# Patient Record
Sex: Female | Born: 1995 | Hispanic: Yes | Marital: Single | Smoking: Never smoker
Health system: Southern US, Community
[De-identification: ages and names within clinical notes are randomized; demographics above are authoritative.]

## PROBLEM LIST (undated history)

## (undated) DIAGNOSIS — N920 Excessive and frequent menstruation with regular cycle: Secondary | ICD-10-CM

## (undated) DIAGNOSIS — R29898 Other symptoms and signs involving the musculoskeletal system: Secondary | ICD-10-CM

## (undated) DIAGNOSIS — F419 Anxiety disorder, unspecified: Secondary | ICD-10-CM

## (undated) DIAGNOSIS — R102 Pelvic and perineal pain unspecified side: Secondary | ICD-10-CM

## (undated) DIAGNOSIS — Q766 Other congenital malformations of ribs: Secondary | ICD-10-CM

## (undated) HISTORY — DX: Other congenital malformations of ribs: Q76.6

## (undated) HISTORY — DX: Anxiety disorder, unspecified: F41.9

## (undated) HISTORY — DX: Other symptoms and signs involving the musculoskeletal system: R29.898

## (undated) HISTORY — DX: Pelvic and perineal pain: R10.2

## (undated) HISTORY — DX: Pelvic and perineal pain unspecified side: R10.20

## (undated) HISTORY — DX: Excessive and frequent menstruation with regular cycle: N92.0

---
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
## 2017-04-08 ENCOUNTER — Encounter: Payer: Self-pay | Admitting: Certified Nurse Midwife

## 2017-04-08 ENCOUNTER — Ambulatory Visit (INDEPENDENT_AMBULATORY_CARE_PROVIDER_SITE_OTHER): Payer: Medicaid Other | Admitting: Certified Nurse Midwife

## 2017-04-08 VITALS — BP 112/83 | HR 79 | Ht 61.0 in | Wt 135.6 lb

## 2017-04-08 DIAGNOSIS — Z0001 Encounter for general adult medical examination with abnormal findings: Secondary | ICD-10-CM

## 2017-04-08 DIAGNOSIS — R1030 Lower abdominal pain, unspecified: Secondary | ICD-10-CM | POA: Diagnosis not present

## 2017-04-08 DIAGNOSIS — R102 Pelvic and perineal pain: Secondary | ICD-10-CM

## 2017-04-08 DIAGNOSIS — N941 Unspecified dyspareunia: Secondary | ICD-10-CM | POA: Diagnosis not present

## 2017-04-08 DIAGNOSIS — Z01411 Encounter for gynecological examination (general) (routine) with abnormal findings: Secondary | ICD-10-CM

## 2017-04-08 MED ORDER — NORETHIN ACE-ETH ESTRAD-FE 1-20 MG-MCG PO TABS: 1.0000 | ORAL_TABLET | Freq: Every day | ORAL | 4 refills | Status: DC

## 2017-04-08 NOTE — Patient Instructions (Signed)
Preventive Care 18-39 Years, Female Preventive care refers to lifestyle choices and visits with your health care provider that can promote health and wellness. What does preventive care include?  A yearly physical exam. This is also called an annual well check.  Dental exams once or twice a year.  Routine eye exams. Ask your health care provider how often you should have your eyes checked.  Personal lifestyle choices, including: ? Daily care of your teeth and gums. ? Regular physical activity. ? Eating a healthy diet. ? Avoiding tobacco and drug use. ? Limiting alcohol use. ? Practicing safe sex. ? Taking vitamin and mineral supplements as recommended by your health care provider. What happens during an annual well check? The services and screenings done by your health care provider during your annual well check will depend on your age, overall health, lifestyle risk factors, and family history of disease. Counseling Your health care provider may ask you questions about your:  Alcohol use.  Tobacco use.  Drug use.  Emotional well-being.  Home and relationship well-being.  Sexual activity.  Eating habits.  Work and work Statistician.  Method of birth control.  Menstrual cycle.  Pregnancy history.  Screening You may have the following tests or measurements:  Height, weight, and BMI.  Diabetes screening. This is done by checking your blood sugar (glucose) after you have not eaten for a while (fasting).  Blood pressure.  Lipid and cholesterol levels. These may be checked every 5 years starting at age 66.  Skin check.  Hepatitis C blood test.  Hepatitis B blood test.  Sexually transmitted disease (STD) testing.  BRCA-related cancer screening. This may be done if you have a family history of breast, ovarian, tubal, or peritoneal cancers.  Pelvic exam and Pap test. This may be done every 3 years starting at age 40. Starting at age 59, this may be done every 5  years if you have a Pap test in combination with an HPV test.  Discuss your test results, treatment options, and if necessary, the need for more tests with your health care provider. Vaccines Your health care provider may recommend certain vaccines, such as:  Influenza vaccine. This is recommended every year.  Tetanus, diphtheria, and acellular pertussis (Tdap, Td) vaccine. You may need a Td booster every 10 years.  Varicella vaccine. You may need this if you have not been vaccinated.  HPV vaccine. If you are 69 or younger, you may need three doses over 6 months.  Measles, mumps, and rubella (MMR) vaccine. You may need at least one dose of MMR. You may also need a second dose.  Pneumococcal 13-valent conjugate (PCV13) vaccine. You may need this if you have certain conditions and were not previously vaccinated.  Pneumococcal polysaccharide (PPSV23) vaccine. You may need one or two doses if you smoke cigarettes or if you have certain conditions.  Meningococcal vaccine. One dose is recommended if you are age 27-21 years and a first-year college student living in a residence hall, or if you have one of several medical conditions. You may also need additional booster doses.  Hepatitis A vaccine. You may need this if you have certain conditions or if you travel or work in places where you may be exposed to hepatitis A.  Hepatitis B vaccine. You may need this if you have certain conditions or if you travel or work in places where you may be exposed to hepatitis B.  Haemophilus influenzae type b (Hib) vaccine. You may need this if  you have certain risk factors.  Talk to your health care provider about which screenings and vaccines you need and how often you need them. This information is not intended to replace advice given to you by your health care provider. Make sure you discuss any questions you have with your health care provider. Document Released: 10/30/2001 Document Revised: 05/23/2016  Document Reviewed: 07/05/2015 Elsevier Interactive Patient Education  2017 [employer].

## 2017-04-08 NOTE — Progress Notes (Signed)
ANNUAL PREVENTATIVE CARE GYN  ENCOUNTER NOTE  Subjective:       Suzanne Nixon is a 21 y.o. G0P0000 female here for a routine annual gynecologic exam.  Current complaints include: dysmenorrhea, dyspareunia, and pelvic pain for the last year. No relief with home measures.  Denies difficulty breathing or respiratory distress, chest pain, excessive vaginal bleeding, change in vaginal discharge, dysuria, and leg pain or swelling.    Gynecologic History  Patient's last menstrual period was 04/01/2017.  Contraception: OCP (estrogen/progesterone)  Last Pap: N/A.   Menstrual History  Period Cycle (Days): 30 Period Duration (Days): four (4) to seven (7) Period Pattern: (!) Irregular Menstrual Flow: Moderate, Heavy Menstrual Control: Tampon, Maxi pad Menstrual Control Change Freq (Hours): two (2) to three (3) Dysmenorrhea: (!) Moderate Dysmenorrhea Symptoms: Cramping, Other (Comment), Nausea, Headache (pelvic pain),  Obstetric History  OB History  Gravida Para Term Preterm AB Living  0 0 0 0 0 0  SAB TAB Ectopic Multiple Live Births  0 0 0 0 0        Past Medical History:  Diagnosis Date  . Heavy periods   . Pelvic pain     History reviewed. No pertinent surgical history.  No Known Allergies  Social History   Social History  . Marital status: Single    Spouse name: N/A  . Number of children: N/A  . Years of education: N/A   Occupational History  . Not on file.   Social History Main Topics  . Smoking status: Never Smoker  . Smokeless tobacco: Never Used  . Alcohol use No  . Drug use: No  . Sexual activity: Yes    Birth control/ protection: Pill   Other Topics Concern  . Not on file   Social History Narrative  . No narrative on file    Family History  Problem Relation Age of Onset  . HIV/AIDS Mother   . Cancer Maternal Uncle        lung    The following portions of the patient's history were reviewed and updated as appropriate: allergies, current  medications, past family history, past medical history, past social history, past surgical history and problem list.  Review of Systems  ROS negative except as noted above. Information obtained from patient.    Objective:   BP 112/83   Pulse 79   Ht 5\' 1"  (1.549 m)   Wt 135 lb 9.6 oz (61.5 kg)   LMP 04/01/2017   BMI 25.62 kg/m    CONSTITUTIONAL: Well-developed, well-nourished female in no acute distress.   PSYCHIATRIC: Normal mood and affect. Normal behavior. Normal judgment and thought content.  NEUROLGIC: Alert and oriented to person, place, and time. Normal muscle tone coordination. No cranial nerve  deficit noted.  HENT:  Normocephalic, atraumatic, External right and left ear normal. Oropharynx is clear and moist  EYES: Conjunctivae and EOM are normal. Pupils are equal, round, and reactive to light. No scleral icterus.   NECK: Normal range of motion, supple, no masses.  Normal thyroid.   SKIN: Skin is warm and dry. No rash noted. Not diaphoretic. No erythema. No pallor.  CARDIOVASCULAR: Normal heart rate noted, regular rhythm, no murmur.  RESPIRATORY: Clear to auscultation bilaterally. Effort and breath sounds normal, no problems with respiration  noted.  BREASTS: Symmetric in size. No masses, skin changes, nipple drainage, or lymphadenopathy.  ABDOMEN: Soft, normal bowel sounds, no distention noted.  No tenderness, rebound or guarding.   PELVIC:  External Genitalia: Normal  Vagina:  Normal  Cervix: Normal  Uterus: Normal  Adnexa: Normal   MUSCULOSKELETAL: Normal range of motion. No tenderness.  No cyanosis, clubbing, or edema.  2+ distal pulses.  LYMPHATIC: No Axillary, Supraclavicular, or Inguinal Adenopathy.  Assessment:   Annual gynecologic examination 21 y.o.   Contraception: OCP (estrogen/progesterone)   Overweight   Problem List Items Addressed This Visit    None    Visit Diagnoses    Pelvic pain    -  Primary   Relevant Orders   US  Transvaginal Non-OB   US Pelvis Complete   Dyspareunia in female       Relevant Orders   US Transvaginal Non-OB   US Pelvis Complete   Lower abdominal pain       Relevant Orders   US Transvaginal Non-OB   US Pelvis Complete   Encounter for gynecological examination with abnormal finding       Relevant Orders   CBC   Comprehensive metabolic panel      Plan:   Pap: Not needed  Labs: CBC, CMP, and NuSwab; see orders. Will contact pt via MyChart with results.  Routine preventative health maintenance measures emphasized: Exercise/Diet/Weight control, Tobacco Warnings, Alcohol/Substance use risks, Stress Management, Peer Pressure Issues and Safe Sex.   Rx: Junel Fe, see orders.   Reviewed red flag symptoms and when to call.   RTC x 1-2 weeks for US due to pelvic pain.   RTC x 1 year for annual exam or sooner if needed.      Gunnar BullaJenkins Michelle Levis Nazir, CNM

## 2017-04-09 LAB — COMPREHENSIVE METABOLIC PANEL
ALBUMIN: 4.3 g/dL (ref 3.5–5.5)
ALK PHOS: 57 IU/L (ref 39–117)
ALT: 15 IU/L (ref 0–32)
AST: 17 IU/L (ref 0–40)
Albumin/Globulin Ratio: 1.3 (ref 1.2–2.2)
BUN / CREAT RATIO: 12 (ref 9–23)
BUN: 9 mg/dL (ref 6–20)
Bilirubin Total: 0.3 mg/dL (ref 0.0–1.2)
CALCIUM: 9.4 mg/dL (ref 8.7–10.2)
CO2: 23 mmol/L (ref 20–29)
CREATININE: 0.73 mg/dL (ref 0.57–1.00)
Chloride: 103 mmol/L (ref 96–106)
GFR calc Af Amer: 138 mL/min/{1.73_m2} (ref >59)
GFR, EST NON AFRICAN AMERICAN: 120 mL/min/{1.73_m2} (ref >59)
GLOBULIN, TOTAL: 3.2 g/dL (ref 1.5–4.5)
GLUCOSE: 85 mg/dL (ref 65–99)
Potassium: 4 mmol/L (ref 3.5–5.2)
Sodium: 140 mmol/L (ref 134–144)
Total Protein: 7.5 g/dL (ref 6.0–8.5)

## 2017-04-09 LAB — CBC
HEMATOCRIT: 41.9 % (ref 34.0–46.6)
HEMOGLOBIN: 14.3 g/dL (ref 11.1–15.9)
MCH: 30.4 pg (ref 26.6–33.0)
MCHC: 34.1 g/dL (ref 31.5–35.7)
MCV: 89 fL (ref 79–97)
Platelets: 246 10*3/uL (ref 150–379)
RBC: 4.71 x10E6/uL (ref 3.77–5.28)
RDW: 13.3 % (ref 12.3–15.4)
WBC: 4.3 10*3/uL (ref 3.4–10.8)

## 2017-04-13 LAB — NUSWAB VAGINITIS PLUS (VG+)
CANDIDA ALBICANS, NAA: NEGATIVE
CANDIDA GLABRATA, NAA: NEGATIVE
CHLAMYDIA TRACHOMATIS, NAA: NEGATIVE
NEISSERIA GONORRHOEAE, NAA: NEGATIVE
TRICH VAG BY NAA: NEGATIVE

## 2017-04-15 NOTE — Progress Notes (Signed)
Please contact patient and advise NuSwab negative and labs normal. Also, encouraged to activate MyChart. Thanks, JML

## 2017-04-19 ENCOUNTER — Telehealth: Payer: Self-pay | Admitting: *Deleted

## 2017-04-19 NOTE — Telephone Encounter (Signed)
-----   Message from Gunnar BullaJenkins Michelle Lawhorn, CNM sent at 04/15/2017  2:00 PM EDT ----- Please contact patient and advise NuSwab negative and labs normal. Also, encouraged to activate MyChart. Thanks, JML

## 2017-04-19 NOTE — Telephone Encounter (Signed)
Mailed all info to pt 

## 2017-04-22 ENCOUNTER — Ambulatory Visit (INDEPENDENT_AMBULATORY_CARE_PROVIDER_SITE_OTHER): Payer: Medicaid Other

## 2017-04-22 DIAGNOSIS — R102 Pelvic and perineal pain: Secondary | ICD-10-CM | POA: Diagnosis not present

## 2017-04-22 DIAGNOSIS — N941 Unspecified dyspareunia: Secondary | ICD-10-CM | POA: Diagnosis not present

## 2017-04-22 DIAGNOSIS — R1030 Lower abdominal pain, unspecified: Secondary | ICD-10-CM | POA: Diagnosis not present

## 2017-04-24 ENCOUNTER — Encounter: Payer: Self-pay | Admitting: Certified Nurse Midwife

## 2017-10-11 ENCOUNTER — Encounter: Payer: Self-pay | Admitting: Certified Nurse Midwife

## 2017-10-15 ENCOUNTER — Ambulatory Visit (INDEPENDENT_AMBULATORY_CARE_PROVIDER_SITE_OTHER): Payer: Medicaid Other | Admitting: Certified Nurse Midwife

## 2017-10-15 VITALS — BP 117/65 | HR 79 | Ht 61.0 in | Wt 127.1 lb

## 2017-10-15 DIAGNOSIS — R222 Localized swelling, mass and lump, trunk: Secondary | ICD-10-CM | POA: Diagnosis not present

## 2017-10-15 DIAGNOSIS — N898 Other specified noninflammatory disorders of vagina: Secondary | ICD-10-CM

## 2017-10-15 NOTE — Progress Notes (Signed)
Pt has a "lump" right side towards her back. Denies soreness.

## 2017-10-17 ENCOUNTER — Encounter: Payer: Self-pay | Admitting: Certified Nurse Midwife

## 2017-10-18 ENCOUNTER — Encounter: Payer: Medicaid Other | Admitting: Certified Nurse Midwife

## 2017-10-19 NOTE — Progress Notes (Signed)
GYN ENCOUNTER NOTE  Subjective:       Suzanne Nixon is a 22 y.o. G0P0000 female here for evaluation of "pain mass/lump on her right side" and vaginal dryness since resuming OCPs.   First noticed intermittent sharp pain with movement at the end of December. Pain returned approximately a week and a half ago. Has not attempted any home treatment measures.   Denies difficulty breathing or respiratory distress, chest pain, excessive vaginal bleeding, dysuria, and leg pain or swelling.    Gynecologic History  Patient's last menstrual period was 09/23/2017 (approximate).   Contraception: OCP (estrogen/progesterone)  Last Pap: N/A.  Obstetric History  OB History  Gravida Para Term Preterm AB Living  0 0 0 0 0 0  SAB TAB Ectopic Multiple Live Births  0 0 0 0 0        Past Medical History:  Diagnosis Date  . Heavy periods   . Pelvic pain     No past surgical history on file.  Current Outpatient Medications on File Prior to Visit  Medication Sig Dispense Refill  . norethindrone-ethinyl estradiol (JUNEL FE 1/20) 1-20 MG-MCG tablet Take 1 tablet by mouth daily. 3 Package 4   No current facility-administered medications on file prior to visit.     No Known Allergies  Social History   Socioeconomic History  . Marital status: Single    Spouse name: Not on file  . Number of children: Not on file  . Years of education: Not on file  . Highest education level: Not on file  Social Needs  . Financial resource strain: Not on file  . Food insecurity - worry: Not on file  . Food insecurity - inability: Not on file  . Transportation needs - medical: Not on file  . Transportation needs - non-medical: Not on file  Occupational History  . Not on file  Tobacco Use  . Smoking status: Never Smoker  . Smokeless tobacco: Never Used  Substance and Sexual Activity  . Alcohol use: No  . Drug use: No  . Sexual activity: Yes    Birth control/protection: Pill  Other Topics Concern  .  Not on file  Social History Narrative  . Not on file    Family History  Problem Relation Age of Onset  . HIV/AIDS Mother   . Cancer Maternal Uncle        lung    The following portions of the patient's history were reviewed and updated as appropriate: allergies, current medications, past family history, past medical history, past social history, past surgical history and problem list.  Review of Systems:  Review of Systems - Negative except as noted above. History obtained from the patient.  Objective:   BP 117/65   Pulse 79   Ht 5\' 1"  (1.549 m)   Wt 127 lb 2 oz (57.7 kg)   LMP 09/23/2017 (Approximate)   BMI 24.02 kg/m    General: Alert and oriented x 4, no apparent distress.   Trunk: Two (2) cm, oblong, oval, firm, mobile, flat mass/lump present on right side; otherwise wnl.   Assessment:   1. Localized swelling, mass and lump, trunk  2. Vaginal dryness  Plan:   Discussed home treatment measures including use of Motrin and Tylenol.   Referral to General Surgery, see orders.   Samples of Uberlube given.  Reviewed red flag symptoms and when to call.   RTC as needed.    Gunnar BullaJenkins Michelle Jary Louvier, CNM [facility], [facility]

## 2017-10-28 ENCOUNTER — Encounter: Payer: Self-pay | Admitting: Certified Nurse Midwife

## 2017-10-28 ENCOUNTER — Ambulatory Visit (INDEPENDENT_AMBULATORY_CARE_PROVIDER_SITE_OTHER): Payer: Medicaid Other | Admitting: Certified Nurse Midwife

## 2017-10-28 VITALS — BP 108/62 | HR 71 | Ht 62.0 in | Wt 128.7 lb

## 2017-10-28 DIAGNOSIS — R102 Pelvic and perineal pain: Secondary | ICD-10-CM | POA: Diagnosis not present

## 2017-10-28 DIAGNOSIS — N898 Other specified noninflammatory disorders of vagina: Secondary | ICD-10-CM

## 2017-10-28 DIAGNOSIS — Z87440 Personal history of urinary (tract) infections: Secondary | ICD-10-CM

## 2017-10-28 LAB — POCT URINALYSIS DIPSTICK
Bilirubin, UA: NEGATIVE
Glucose, UA: NEGATIVE
KETONES UA: NEGATIVE
Leukocytes, UA: NEGATIVE
Nitrite, UA: NEGATIVE
PH UA: 6 (ref 5.0–8.0)
Protein, UA: NEGATIVE
RBC UA: NEGATIVE
SPEC GRAV UA: 1.015 (ref 1.010–1.025)
UROBILINOGEN UA: 0.2 U/dL

## 2017-10-28 NOTE — Patient Instructions (Addendum)
Vaginitis Vaginitis is an inflammation of the vagina. It can happen when the normal bacteria and yeast in the vagina grow too much. There are different types. Treatment will depend on the type you have. Follow these instructions at home:  Take all medicines as told by your doctor.  Keep your vagina area clean and dry. Avoid soap. Rinse the area with water.  Avoid washing and cleaning out the vagina (douching).  Do not use tampons or have sex (intercourse) until your treatment is done.  Wipe from front to back after going to the restroom.  Wear cotton underwear.  Avoid wearing underwear while you sleep until your vaginitis is gone.  Avoid tight pants. Avoid underwear or nylons without a cotton panel.  Take off wet clothing (such as a bathing suit) as soon as you can.  Use mild, unscented products. Avoid fabric softeners and scented: ? Feminine sprays. ? Laundry detergents. ? Tampons. ? Soaps or bubble baths.  Practice safe sex and use condoms. Get help right away if:  You have belly (abdominal) pain.  You have a fever or lasting symptoms for more than 2-3 days.  You have a fever and your symptoms suddenly get worse. This information is not intended to replace advice given to you by your health care provider. Make sure you discuss any questions you have with your health care provider. Document Released: 11/30/2008 Document Revised: 02/09/2016 Document Reviewed: 02/14/2012 Elsevier Interactive Patient Education  2017 Elsevier Inc. Pelvic Pain, Female Pelvic pain is pain in your lower belly (abdomen), below your belly button and between your hips. The pain may start suddenly (acute), keep coming back (recurring), or last a long time (chronic). Pelvic pain that lasts longer than six months is considered chronic. There are many causes of pelvic pain. Sometimes the cause of your pelvic pain is not known. Follow these instructions at home:  Take over-the-counter and prescription  medicines only as told by your doctor.  Rest as told by your doctor.  Do not have sex it if hurts.  Keep a journal of your pelvic pain. Write down: ? When the pain started. ? Where the pain is located. ? What seems to make the pain better or worse, such as food or your menstrual cycle. ? Any symptoms you have along with the pain.  Keep all follow-up visits as told by your doctor. This is important. Contact a doctor if:  Medicine does not help your pain.  Your pain comes back.  You have new symptoms.  You have unusual vaginal discharge or bleeding.  You have a fever or chills.  You are having a hard time pooping (constipation).  You have blood in your pee (urine) or poop (stool).  Your pee smells bad.  You feel weak or lightheaded. Get help right away if:  You have sudden pain that is very bad.  Your pain continues to get worse.  You have very bad pain and also have any of the following symptoms: ? A fever. ? Feeling stick to your stomach (nausea). ? Throwing up (vomiting). ? Being very sweaty.  You pass out (lose consciousness). This information is not intended to replace advice given to you by your health care provider. Make sure you discuss any questions you have with your health care provider. Document Released: 02/20/2008 Document Revised: 09/28/2015 Document Reviewed: 06/24/2015 Elsevier Interactive Patient Education  [employer]2018 Else[employer] Inc[employer]

## 2017-10-29 NOTE — Progress Notes (Signed)
CHIEF COMPLAINT/HPI:  22 y.o. female complains of clear, malodorous, thin and yellow vaginal discharge for the last week(s). History significant for pelvic pain.   Denies abnormal vaginal bleeding\ or fever. No UTI symptoms. Sexually active, does not use condoms, no change in partner.    Denies difficulty breathing or respiratory distress, abdominal pain, excessive vaginal bleeding, dysuria, and leg pain or swelling.   Review of Systems   Negative except as note above. Information obtained form patient.   Past Medical History:  Past Medical History:  Diagnosis Date  . Heavy periods   . Pelvic pain     Past Surgical History:  History reviewed. No pertinent surgical history.  Obstetrical History:  OB History    Gravida Para Term Preterm AB Living   0 0 0 0 0 0   SAB TAB Ectopic Multiple Live Births   0 0 0 0 0      Gynecological History:  Contraception: OCP (estrogen/progesterone)  Last pap:N/A.   Social History:  Social History   Socioeconomic History  . Marital status: In a relationship    Spouse name: None  . Number of children: None  . Years of education: None  . Highest education level: None  Social Needs  . Financial resource strain: None  . Food insecurity - worry: None  . Food insecurity - inability: None  . Transportation needs - medical: None  . Transportation needs - non-medical: None  Occupational History  . None  Tobacco Use  . Smoking status: Never Smoker  . Smokeless tobacco: Never Used  Substance and Sexual Activity  . Alcohol use: No  . Drug use: No  . Sexual activity: Yes    Birth control/protection: Pill  Other Topics Concern  . None  Social History Narrative  . None    Family History: Family History  Problem Relation Age of Onset  . HIV/AIDS Mother   . Cancer Maternal Uncle        lung    Allergies: No Known Allergies   PHYSICAL EXAM:  BP 108/62   Pulse 71   Ht 5\' 2"  (1.575 m)   Wt 128 lb 11.2 oz (58.4 kg)   LMP  10/23/2017   BMI 23.54 kg/m   Pelvic - VULVA: normal appearing vulva with no masses, tenderness or lesions, VAGINA: normal appearing vagina with normal color and discharge, no lesions, CERVIX: cervical motion tenderness absent, UTERUS: uterus is normal size, shape, consistency and nontender, ADNEXA: normal adnexa in size, nontender and no masses  Urinalysis    Component Value Date/Time   BILIRUBINUR neg 10/28/2017 1124   PROTEINUR neg 10/28/2017 1124   UROBILINOGEN 0.2 10/28/2017 1124   NITRITE neg 10/28/2017 1124   LEUKOCYTESUR Negative 10/28/2017 1124     Assessment:  Pelvic pain  Vaginal discharge  History of UTI  STI screening  Plan:  Labs: NuSwab and Urine culture, see orders. Will contact patient via MyChart with results.   Discussed home vaginal health techniques.   Reviewed red flag symptoms and when to call.    Gunnar BullaJenkins Michelle Juaquin Ludington, CNM [facility], [facility]

## 2017-10-30 LAB — URINE CULTURE: ORGANISM ID, BACTERIA: NO GROWTH

## 2017-10-31 ENCOUNTER — Ambulatory Visit (INDEPENDENT_AMBULATORY_CARE_PROVIDER_SITE_OTHER): Payer: Medicaid Other | Admitting: Surgery

## 2017-10-31 ENCOUNTER — Encounter: Payer: Self-pay | Admitting: Surgery

## 2017-10-31 VITALS — BP 122/71 | HR 73 | Temp 97.3°F | Ht 64.0 in | Wt 128.0 lb

## 2017-10-31 DIAGNOSIS — R222 Localized swelling, mass and lump, trunk: Secondary | ICD-10-CM

## 2017-10-31 DIAGNOSIS — R0789 Other chest pain: Secondary | ICD-10-CM | POA: Diagnosis not present

## 2017-10-31 LAB — NUSWAB VAGINITIS PLUS (VG+)
Atopobium vaginae: HIGH Score — AB
BVAB 2: HIGH {score} — AB
CANDIDA ALBICANS, NAA: POSITIVE — AB
CHLAMYDIA TRACHOMATIS, NAA: NEGATIVE
Candida glabrata, NAA: NEGATIVE
MEGASPHAERA 1: HIGH {score} — AB
Neisseria gonorrhoeae, NAA: NEGATIVE
TRICH VAG BY NAA: NEGATIVE

## 2017-10-31 NOTE — Progress Notes (Signed)
10/31/2017  Reason for Visit:  Right thorax mass  Referring Provider:   Holly Bodily, CNM  History of Present Illness: Suzanne Nixon is a 22 y.o. female who presents as a referral for evaluation of a right thoracic mass.  The patient reports that she does self breast exams and a couple of months ago noted that she had a swelling on the right back of the thorax.  She does not feel that it's grown in size but did have an episode of some tenderness around it.  Denies any trauma or other injury.  Denies any fevers, chills.  Denies any redness of the skin or drainage from that area.  Her PCP referred her for further evaluation.  Past Medical History: Past Medical History:  Diagnosis Date  . Heavy periods   . Pelvic pain      Past Surgical History: None  Home Medications: Prior to Admission medications   Medication Sig Start Date End Date Taking? Authorizing Provider  norethindrone-ethinyl estradiol (JUNEL FE 1/20) 1-20 MG-MCG tablet Take 1 tablet by mouth daily. 04/08/17  Yes Lawhorn, Vanessa Sidney, CNM    Allergies: No Known Allergies  Social History:  reports that  has never smoked. she has never used smokeless tobacco. She reports that she does not drink alcohol or use drugs.   Family History: Family History  Problem Relation Age of Onset  . HIV/AIDS Mother   . Cancer Maternal Uncle        lung    Review of Systems: Review of Systems  Constitutional: Negative for chills and fever.  Respiratory: Negative for shortness of breath.   Cardiovascular: Negative for chest pain.  Gastrointestinal: Negative for abdominal pain, nausea and vomiting.  Genitourinary: Negative for dysuria.  Musculoskeletal: Positive for back pain.  Skin: Negative for rash.  Neurological: Negative for dizziness.  Psychiatric/Behavioral: Negative for depression.  All other systems reviewed and are negative.   Physical Exam BP 122/71   Pulse 73   Temp (!) 97.3 F (36.3 C) (Oral)   Ht 5'  4" (1.626 m)   Wt 58.1 kg (128 lb)   LMP 10/23/2017   BMI 21.97 kg/m  CONSTITUTIONAL: No acute distress HEENT:  Normocephalic, atraumatic, extraocular motion intact. NECK: Trachea is midline, and there is no jugular venous distension.  RESPIRATORY:  Lungs are clear, and breath sounds are equal bilaterally. Normal respiratory effort without pathologic use of accessory muscles. CARDIOVASCULAR: Heart is regular without murmurs, gallops, or rubs. GI: The abdomen is soft, nondistended, nontender.  MUSCULOSKELETAL:  Patient has on the right posterior back a area of firmness and swelling, that is more noticeable when she turns her torso towards the left, which makes the mass bulge out more.  On palpation, it is firm and measure about 3 cm is size, and is not tender.  It is non-mobile.  On tracking with her ribs, this may be the 12th rib on the right side.  On the left side, a similar area is seen though with much less bulging when she turns to the right. SKIN: Skin turgor is normal. There are no pathologic skin lesions.  NEUROLOGIC:  Motor and sensation is grossly normal.  Cranial nerves are grossly intact. PSYCH:  Alert and oriented to person, place and time. Affect is normal.  Laboratory Analysis: No results found for this or any previous visit (from the past 24 hour(s)).  Imaging: No results found.  Assessment and Plan: This is a 22 y.o. female who presents with a palpable right  posterior thorax mass.  Discussed with the patient that this feels hard, and lipoma would be lower in the differential.  I believe this could be part of the 12th rib that is bulging when she turns.  I am less concerned about a potential mass or tumor, but given the uncertainty, will order a CT scan of the chest to evaluate her ribs and for any potential masses.  Depending on the results, she could potentially need referral to Thoracic Surgery with Dr. Thelma Bargeaks.  Patient understands this plan and all of her questions have  been answered.  Face-to-face time spent with the patient and care providers was 60 minutes, with more than 50% of the time spent counseling, educating, and coordinating care of the patient.     Howie IllJose Luis Kamisha Ell, MD [facility]

## 2017-10-31 NOTE — Patient Instructions (Signed)
We will give you a call you once we have the results. We will also let you know if your insurance does not want to cover for the CT Scan.   We will call you with results.

## 2017-11-01 NOTE — Progress Notes (Signed)
Patient positive for yeast and Bv. Please contact to see which route of treatment she desires for each. Thanks, JML

## 2017-11-06 ENCOUNTER — Telehealth: Payer: Self-pay

## 2017-11-06 MED ORDER — METRONIDAZOLE 500 MG PO TABS: 500.0000 mg | ORAL_TABLET | Freq: Two times a day (BID) | ORAL | 0 refills | Status: DC

## 2017-11-06 NOTE — Telephone Encounter (Signed)
Pt aware of nuswab results. She prefers to get monistat otc for y/i. Will give flagyl for BV.

## 2017-11-06 NOTE — Telephone Encounter (Signed)
-----   Message from Gunnar BullaJenkins Michelle Lawhorn, CNM sent at 11/01/2017  1:54 PM EST ----- Patient positive for yeast and Bv. Please contact to see which route of treatment she desires for each. Thanks, JML

## 2017-11-11 ENCOUNTER — Other Ambulatory Visit: Payer: Self-pay

## 2017-11-11 DIAGNOSIS — R222 Localized swelling, mass and lump, trunk: Secondary | ICD-10-CM

## 2017-11-12 ENCOUNTER — Ambulatory Visit
Admission: RE | Admit: 2017-11-12 | Discharge: 2017-11-12 | Disposition: A | Discharge status: Home / Self Care | Payer: Medicaid Other | Source: Ambulatory Visit | Attending: Surgery | Admitting: Surgery

## 2017-11-12 ENCOUNTER — Ambulatory Visit: Admission: RE | Admit: 2017-11-12 | Payer: Medicaid Other | Source: Ambulatory Visit

## 2017-11-12 DIAGNOSIS — R222 Localized swelling, mass and lump, trunk: Secondary | ICD-10-CM | POA: Insufficient documentation

## 2017-11-13 ENCOUNTER — Telehealth: Payer: Self-pay | Admitting: Surgery

## 2017-11-13 NOTE — Telephone Encounter (Signed)
Patient would like to have CT of chest with contrast done at [facility]. Reason: patient lives in [place] and prefers this location due to distance from home.   [facility]. FAX: [phone] Phone: [phone].  I have informed patient that the orders have been faxed and she should be receiving a phone call from [facility] to [facility]. I will follow up to make sure an appointment is made. When imaging has been completed I will ask for [facility].

## 2017-11-14 NOTE — Telephone Encounter (Signed)
Patient has called and the CT scan has been scheduled with [facility]--11/19/17 @ 3:40pm. I have told the patient that we will request to have the images pushed into [facility] so that Dr Aleen CampiPiscoya can review the images and will call to either discuss the results or have the patient come in for an office visit.   Authorization has been obtained from [employer] for CPT: 71260-CT of chest with contrast to be done at [facility]-NPI:[id number].  Valid from 11/13/17-12/13/17-Ordered by Dr Aleen CampiPiscoya.  Authorization # [id number]

## 2017-11-20 ENCOUNTER — Telehealth: Payer: Self-pay

## 2017-11-20 NOTE — Telephone Encounter (Signed)
I called patient to let her know that her CT Scan was normal. However, I told patient that I will send Dr. Aleen CampiPiscoya a message to let him know to review her CT and then I call her with his recommendations. Patient understood.

## 2017-11-22 NOTE — Telephone Encounter (Signed)
-----   Message from Jose Piscoya, MD sent at 11/22/2017  2:18 PM EST ----- Regarding: RE: CT Scan results Hi Laqueta Bonaventura,  Finally was able to see the CT scan for this patient.  I agree with the read that there is nothing abnormal.  I do not see any masses particularly in the area that she's concerned with.  One concern is that still this could be a rib that moves when she twists her torso, and unfortunately, the CT scan is done with her flat and straight.    If she wants, we can send a referral for Dr. Oaks so he can evaluate her and see if it's truly a rib issue, he can resect it.  Thanks!  Jose   ----- Message ----- From: Breniya Goertzen, CMA Sent: 11/20/2017   2:10 PM To: Jose Piscoya, MD Subject: CT Scan results                                Patiet's CT Scan was done yesterday at [facility] and it did not show anything. What do you want me to do with the patient. Please advise. Thank you!  

## 2017-11-22 NOTE — Telephone Encounter (Signed)
Called patient and was not able to leave her a voicemail. I will try to call her back on Monday.

## 2017-11-26 ENCOUNTER — Encounter: Payer: Medicaid Other | Admitting: Certified Nurse Midwife

## 2017-11-26 NOTE — Telephone Encounter (Signed)
CT was done and patient knows about her results and Dr. Adelene IdlerPiscoya's recommendations. Please see other note.

## 2017-11-26 NOTE — Telephone Encounter (Signed)
-----   Message from Henrene DodgeJose Piscoya, MD sent at 11/22/2017  2:18 PM EST ----- Regarding: RE: CT Scan results Hi Greycen Felter,  Finally was able to see the CT scan for this patient.  I agree with the read that there is nothing abnormal.  I do not see any masses particularly in the area that she's concerned with.  One concern is that still this could be a rib that moves when she twists her torso, and unfortunately, the CT scan is done with her flat and straight.    If she wants, we can send a referral for Dr. Thelma Bargeaks so he can evaluate her and see if it's truly a rib issue, he can resect it.  Thanks!  Jose   ----- Message ----- From: Adela PortsBoniche, Mael Delap, CMA Sent: 11/20/2017   2:10 PM To: Henrene DodgeJose Piscoya, MD Subject: CT Scan results                                Patiet[facility] CT [facility] and it did not show anything. What do you want me to do with the patient. Please advise. Thank you!

## 2017-11-26 NOTE — Telephone Encounter (Signed)
Called patient and was able to finally speak with her. I told her what her CT Scan results and also gave her the option to see Dr. Thelma Bargeaks if it was truly bothering her as per Dr. Adelene IdlerPiscoya's recommendations. However, patient stated that she was fine but if she felt that it was getting worse, she would call us to schedule a consult with Dr. Thelma Bargeaks. Patient had no questions for me.

## 2018-04-09 ENCOUNTER — Telehealth: Payer: Self-pay | Admitting: Certified Nurse Midwife

## 2018-04-09 NOTE — Telephone Encounter (Signed)
error 

## 2018-04-10 ENCOUNTER — Ambulatory Visit (INDEPENDENT_AMBULATORY_CARE_PROVIDER_SITE_OTHER): Payer: Medicaid Other | Admitting: Certified Nurse Midwife

## 2018-04-10 VITALS — BP 97/58 | HR 66 | Ht 62.0 in | Wt 134.4 lb

## 2018-04-10 DIAGNOSIS — Z01419 Encounter for gynecological examination (general) (routine) without abnormal findings: Secondary | ICD-10-CM

## 2018-04-10 DIAGNOSIS — Z3041 Encounter for surveillance of contraceptive pills: Secondary | ICD-10-CM

## 2018-04-10 DIAGNOSIS — Z309 Encounter for contraceptive management, unspecified: Secondary | ICD-10-CM | POA: Diagnosis not present

## 2018-04-10 MED ORDER — NORETHIN ACE-ETH ESTRAD-FE 1-20 MG-MCG PO TABS: 1.0000 | ORAL_TABLET | Freq: Every day | ORAL | 4 refills | Status: DC

## 2018-04-10 NOTE — Patient Instructions (Addendum)
Preventive Care 18-39 Years, Female Preventive care refers to lifestyle choices and visits with your health care provider that can promote health and wellness. What does preventive care include?  A yearly physical exam. This is also called an annual well check.  Dental exams once or twice a year.  Routine eye exams. Ask your health care provider how often you should have your eyes checked.  Personal lifestyle choices, including: ? Daily care of your teeth and gums. ? Regular physical activity. ? Eating a healthy diet. ? Avoiding tobacco and drug use. ? Limiting alcohol use. ? Practicing safe sex. ? Taking vitamin and mineral supplements as recommended by your health care provider. What happens during an annual well check? The services and screenings done by your health care provider during your annual well check will depend on your age, overall health, lifestyle risk factors, and family history of disease. Counseling Your health care provider may ask you questions about your:  Alcohol use.  Tobacco use.  Drug use.  Emotional well-being.  Home and relationship well-being.  Sexual activity.  Eating habits.  Work and work Statistician.  Method of birth control.  Menstrual cycle.  Pregnancy history.  Screening You may have the following tests or measurements:  Height, weight, and BMI.  Diabetes screening. This is done by checking your blood sugar (glucose) after you have not eaten for a while (fasting).  Blood pressure.  Lipid and cholesterol levels. These may be checked every 5 years starting at age 66.  Skin check.  Hepatitis C blood test.  Hepatitis B blood test.  Sexually transmitted disease (STD) testing.  BRCA-related cancer screening. This may be done if you have a family history of breast, ovarian, tubal, or peritoneal cancers.  Pelvic exam and Pap test. This may be done every 3 years starting at age 40. Starting at age 59, this may be done every 5  years if you have a Pap test in combination with an HPV test.  Discuss your test results, treatment options, and if necessary, the need for more tests with your health care provider. Vaccines Your health care provider may recommend certain vaccines, such as:  Influenza vaccine. This is recommended every year.  Tetanus, diphtheria, and acellular pertussis (Tdap, Td) vaccine. You may need a Td booster every 10 years.  Varicella vaccine. You may need this if you have not been vaccinated.  HPV vaccine. If you are 69 or younger, you may need three doses over 6 months.  Measles, mumps, and rubella (MMR) vaccine. You may need at least one dose of MMR. You may also need a second dose.  Pneumococcal 13-valent conjugate (PCV13) vaccine. You may need this if you have certain conditions and were not previously vaccinated.  Pneumococcal polysaccharide (PPSV23) vaccine. You may need one or two doses if you smoke cigarettes or if you have certain conditions.  Meningococcal vaccine. One dose is recommended if you are age 27-21 years and a first-year college student living in a residence hall, or if you have one of several medical conditions. You may also need additional booster doses.  Hepatitis A vaccine. You may need this if you have certain conditions or if you travel or work in places where you may be exposed to hepatitis A.  Hepatitis B vaccine. You may need this if you have certain conditions or if you travel or work in places where you may be exposed to hepatitis B.  Haemophilus influenzae type b (Hib) vaccine. You may need this if  you have certain risk factors.  Talk to your health care provider about which screenings and vaccines you need and how often you need them. This information is not intended to replace advice given to you by your health care provider. Make sure you discuss any questions you have with your health care provider. Document Released: 10/30/2001 Document Revised: 05/23/2016  Document Reviewed: 07/05/2015 Elsevier Interactive Patient Education  2018 Elsevier Inc. Ethinyl Estradiol; Norethindrone Acetate; Ferrous fumarate tablets or capsules What is this medicine? ETHINYL ESTRADIOL; NORETHINDRONE ACETATE; FERROUS FUMARATE (ETH in il es tra DYE ole; nor eth IN drone AS e tate; FER us FUE ma rate) is an oral contraceptive. The products combine two types of female hormones, an estrogen and a progestin. They are used to prevent ovulation and pregnancy. Some products are also used to treat acne in females. This medicine may be used for other purposes; ask your health care provider or pharmacist if you have questions. COMMON BRAND NAME(S): Blisovi 24 Fe, Blisovi Fe, Estrostep Fe, Gildess 24 Fe, Gildess Fe 1.5/30, Gildess Fe 1/20, Junel Fe 1.5/30, Junel Fe 1/20, Junel Fe 24, Larin Fe, Lo Loestrin Fe, Loestrin 24 Fe, Loestrin FE 1.5/30, Loestrin FE 1/20, Lomedia 24 Fe, Microgestin 24 Fe, Microgestin Fe 1.5/30, Microgestin Fe 1/20, Tarina Fe 1/20, Taytulla, Tilia Fe, Tri-Legest Fe What should I tell my health care provider before I take this medicine? They need to know if you have any of these conditions: -abnormal vaginal bleeding -blood vessel disease -breast, cervical, endometrial, ovarian, liver, or uterine cancer -diabetes -gallbladder disease -heart disease or recent heart attack -high blood pressure -high cholesterol -history of blood clots -kidney disease -liver disease -migraine headaches -smoke tobacco -stroke -systemic lupus erythematosus (SLE) -an unusual or allergic reaction to estrogens, progestins, other medicines, foods, dyes, or preservatives -pregnant or trying to get pregnant -breast-feeding How should I use this medicine? Take this medicine by mouth. To reduce nausea, this medicine may be taken with food. Follow the directions on the prescription label. Take this medicine at the same time each day and in the order directed on the package. Do not  take your medicine more often than directed. A patient package insert for the product will be given with each prescription and refill. Read this sheet carefully each time. The sheet may change frequently. Contact your pediatrician regarding the use of this medicine in children. Special care may be needed. This medicine has been used in female children who have started having menstrual periods. Overdosage: If you think you have taken too much of this medicine contact a poison control center or emergency room at once. NOTE: This medicine is only for you. Do not share this medicine with others. What if I miss a dose? If you miss a dose, refer to the patient information sheet you received with your medicine for direction. If you miss more than one pill, this medicine may not be as effective and you may need to use another form of birth control. What may interact with this medicine? Do not take this medicine with the following medication: -dasabuvir; ombitasvir; paritaprevir; ritonavir -ombitasvir; paritaprevir; ritonavir This medicine may also interact with the following medications: -acetaminophen -antibiotics or medicines for infections, especially rifampin, rifabutin, rifapentine, and griseofulvin, and possibly penicillins or tetracyclines -aprepitant -ascorbic acid (vitamin C) -atorvastatin -barbiturate medicines, such as phenobarbital -bosentan -carbamazepine -caffeine -clofibrate -cyclosporine -dantrolene -doxercalciferol -felbamate -grapefruit juice -hydrocortisone -medicines for anxiety or sleeping problems, such as diazepam or temazepam -medicines for diabetes, including pioglitazone -mineral oil -  oil -modafinil -mycophenolate -nefazodone -oxcarbazepine -phenytoin -prednisolone -ritonavir or other medicines for HIV infection or AIDS -rosuvastatin -selegiline -soy isoflavones supplements -St. John's wort -tamoxifen or raloxifene -theophylline -thyroid  hormones -topiramate -warfarin This list may not describe all possible interactions. Give your health care provider a list of all the medicines, herbs, non-prescription drugs, or dietary supplements you use. Also tell them if you smoke, drink alcohol, or use illegal drugs. Some items may interact with your medicine. What should I watch for while using this medicine? Visit your doctor or health care professional for regular checks on your progress. You will need a regular breast and pelvic exam and Pap smear while on this medicine. Use an additional method of contraception during the first cycle that you take these tablets. If you have any reason to think you are pregnant, stop taking this medicine right away and contact your doctor or health care professional. If you are taking this medicine for hormone related problems, it may take several cycles of use to see improvement in your condition. Smoking increases the risk of getting a blood clot or having a stroke while you are taking birth control pills, especially if you are more than 21 years old. You are strongly advised not to smoke. This medicine can make your body retain fluid, making your fingers, hands, or ankles swell. Your blood pressure can go up. Contact your doctor or health care professional if you feel you are retaining fluid. This medicine can make you more sensitive to the sun. Keep out of the sun. If you cannot avoid being in the sun, wear protective clothing and use sunscreen. Do not use sun lamps or tanning beds/booths. If you wear contact lenses and notice visual changes, or if the lenses begin to feel uncomfortable, consult your eye care specialist. In some women, tenderness, swelling, or minor bleeding of the gums may occur. Notify your dentist if this happens. Brushing and flossing your teeth regularly may help limit this. See your dentist regularly and inform your dentist of the medicines you are taking. If you are going to have  elective surgery, you may need to stop taking this medicine before the surgery. Consult your health care professional for advice. This medicine does not protect you against HIV infection (AIDS) or any other sexually transmitted diseases. What side effects may I notice from receiving this medicine? Side effects that you should report to your doctor or health care professional as soon as possible: -allergic reactions like skin rash, itching or hives, swelling of the face, lips, or tongue -breast tissue changes or discharge -changes in vaginal bleeding during your period or between your periods -changes in vision -chest pain -confusion -coughing up blood -dizziness -feeling faint or lightheaded -headaches or migraines -leg, arm or groin pain -loss of balance or coordination -severe or sudden headaches -stomach pain (severe) -sudden shortness of breath -sudden numbness or weakness of the face, arm or leg -symptoms of vaginal infection like itching, irritation or unusual discharge -tenderness in the upper abdomen -trouble speaking or understanding -vomiting -yellowing of the eyes or skin Side effects that usually do not require medical attention (report to your doctor or health care professional if they continue or are bothersome): -breakthrough bleeding and spotting that continues beyond the 3 initial cycles of pills -breast tenderness -mood changes, anxiety, depression, frustration, anger, or emotional outbursts -increased sensitivity to sun or ultraviolet light -nausea -skin rash, acne, or brown spots on the skin -weight gain (slight) This list may not describe all   possible side effects. Call your doctor for medical advice about side effects. You may report side effects to FDA at 1-800-FDA-1088. Where should I keep my medicine? Keep out of the reach of children. Store at room temperature between 15 and 30 degrees C (59 and 86 degrees F). Throw away any unused medicine after the  expiration date. NOTE: This sheet is a summary. It may not cover all possible information. If you have questions about this medicine, talk to your doctor, pharmacist, or health care provider.  2018 Elsevier/Gold Standard (2016-05-14 08:04:41)

## 2018-04-10 NOTE — Progress Notes (Signed)
Pt is here for an annual exam.

## 2018-04-14 DIAGNOSIS — Z3041 Encounter for surveillance of contraceptive pills: Secondary | ICD-10-CM | POA: Insufficient documentation

## 2018-04-14 NOTE — Progress Notes (Signed)
ANNUAL PREVENTATIVE CARE GYN  ENCOUNTER NOTE  Subjective:       Suzanne Nixon is a 22 y.o. G0P0000 female here for a routine annual gynecologic exam.  Current complaints: 1. Needs refill of OCP  Doing well overall. [facility] [employer].   Denies difficulty breathing or respiratory distress, chest pain, abdominal pain, excessive vaginal bleeding, dysuria, and leg pain or swelling.    Gynecologic History  Patient's last menstrual period was 03/16/2018 (approximate).  Contraception: OCP (estrogen/progesterone)  Last Pap: N/A.   Obstetric History OB History  Gravida Para Term Preterm AB Living  0 0 0 0 0 0  SAB TAB Ectopic Multiple Live Births  0 0 0 0 0    Past Medical History:  Diagnosis Date  . Heavy periods   . Pelvic pain    No Known Allergies  Social History   Socioeconomic History  . Marital status: Single    Spouse name: Not on file  . Number of children: Not on file  . Years of education: Not on file  . Highest education level: Not on file  Occupational History  . Not on file  Social Needs  . Financial resource strain: Not on file  . Food insecurity:    Worry: Not on file    Inability: Not on file  . Transportation needs:    Medical: Not on file    Non-medical: Not on file  Tobacco Use  . Smoking status: Never Smoker  . Smokeless tobacco: Never Used  Substance and Sexual Activity  . Alcohol use: No  . Drug use: No  . Sexual activity: Yes    Birth control/protection: Pill  Lifestyle  . Physical activity:    Days per week: Not on file    Minutes per session: Not on file  . Stress: Not on file  Relationships  . Social connections:    Talks on phone: Not on file    Gets together: Not on file    Attends religious service: Not on file    Active member of club or organization: Not on file    Attends meetings of clubs or organizations: Not on file    Relationship status: Not on file  . Intimate partner violence:    Fear of  current or ex partner: Not on file    Emotionally abused: Not on file    Physically abused: Not on file    Forced sexual activity: Not on file  Other Topics Concern  . Not on file  Social History Narrative  . Not on file    Family History  Problem Relation Age of Onset  . HIV/AIDS Mother   . Cancer Maternal Uncle        lung    The following portions of the patient's history were reviewed and updated as appropriate: allergies, current medications, past family history, past medical history, past social history, past surgical history and problem list.  Review of Systems  ROS negative except as noted above. Information obtained from patient.   Objective:   BP (!) 97/58   Pulse 66   Ht 5\' 2"  (1.575 m)   Wt 134 lb 7 oz (61 kg)   LMP 03/16/2018 (Approximate)   BMI 24.59 kg/m    CONSTITUTIONAL: Well-developed, well-nourished female in no acute distress.   PSYCHIATRIC: Normal mood and affect. Normal behavior. Normal judgment and thought content.  NEUROLGIC: Alert and oriented to person, place, and time. Normal muscle tone coordination. No cranial nerve deficit noted.  HENT:  Normocephalic, atraumatic, External right and left ear normal.   EYES: Conjunctivae and EOM are normal. Pupils are equal and round.   NECK: Normal range of motion, supple, no masses.  Normal thyroid.   SKIN: Skin is warm and dry. No rash noted. Not diaphoretic. No erythema. No pallor.  CARDIOVASCULAR: Normal heart rate noted, regular rhythm, no murmur.  RESPIRATORY: Clear to auscultation bilaterally. Effort and breath sounds normal, no problems with respiration noted.  BREASTS: Symmetric in size. No masses, skin changes, nipple drainage, or lymphadenopathy.  ABDOMEN: Soft, normal bowel sounds, no distention noted.  No tenderness, rebound or guarding.   PELVIC:  External Genitalia: Normal  Vagina: Normal  Cervix: Normal  Uterus: Normal  Adnexa: Normal   MUSCULOSKELETAL: Normal range of motion.  No tenderness.  No cyanosis, clubbing, or edema.  2+ distal pulses.  LYMPHATIC: No Axillary, Supraclavicular, or Inguinal Adenopathy.  Assessment:   Annual gynecologic examination 22 y.o.   Contraception: OCP (estrogen/progesterone)   Normal BMI   Problem List Items Addressed This Visit    None    Visit Diagnoses    Well woman exam    -  Primary   Uses oral contraception          Plan:   Pap: Not needed  Labs: Declined  Routine preventative health maintenance measures emphasized: Exercise/Diet/Weight control, Tobacco Warnings, Alcohol/Substance use risks, Stress Management, Peer Pressure Issues and Safe Sex; see AVS  Rx: Junel, see orders  Reviewed red flag symptoms and when to call  RTC x 1 year for Annual Exam or sooner if needed   Gunnar Bulla, CNM [facility]

## 2018-09-05 ENCOUNTER — Encounter: Payer: Self-pay | Admitting: Certified Nurse Midwife

## 2019-01-06 ENCOUNTER — Encounter: Payer: Self-pay | Admitting: Certified Nurse Midwife

## 2019-01-09 ENCOUNTER — Other Ambulatory Visit: Payer: Self-pay

## 2019-01-09 ENCOUNTER — Ambulatory Visit (INDEPENDENT_AMBULATORY_CARE_PROVIDER_SITE_OTHER): Payer: Medicaid Other | Admitting: Certified Nurse Midwife

## 2019-01-09 ENCOUNTER — Encounter: Payer: Self-pay | Admitting: Certified Nurse Midwife

## 2019-01-09 VITALS — BP 118/67 | HR 77 | Ht 62.0 in | Wt 136.1 lb

## 2019-01-09 DIAGNOSIS — N912 Amenorrhea, unspecified: Secondary | ICD-10-CM

## 2019-01-09 DIAGNOSIS — Z3201 Encounter for pregnancy test, result positive: Secondary | ICD-10-CM

## 2019-01-09 LAB — POCT URINE PREGNANCY: Preg Test, Ur: POSITIVE — AB

## 2019-01-09 NOTE — Progress Notes (Signed)
GYN ENCOUNTER NOTE  Subjective:       Suzanne Nixon is a 23 y.o. G0P0000 female is here for confirmation of pregnancy.   Reports two (2) positive home pregnancy test. Endorses nausea with a single episode of vomiting, breast tenderness, and intermittent abdominal cramping.   Denies difficulty breathing or respiratory distress, chest pain, vaginal bleeding, dysuria, and leg pain or swelling.   Pregnancy was unplanned yet not prevented. Not taking prenatal vitamins at this time. Uncertain regarding parenting, adopting, or terminating pregnancy.    Gynecologic History  Patient's last menstrual period was 11/25/2018 (approximate).  Contraception: none  Last Pap: due.   Obstetric History  OB History  Gravida Para Term Preterm AB Living  0 0 0 0 0 0  SAB TAB Ectopic Multiple Live Births  0 0 0 0 0    Past Medical History:  Diagnosis Date  . Anxiety   . Finding of floating rib   . Heavy periods   . Pelvic pain    No Known Allergies  Social History   Socioeconomic History  . Marital status: Single    Spouse name: Not on file  . Number of children: Not on file  . Years of education: Not on file  . Highest education level: Not on file  Occupational History  . Not on file  Social Needs  . Financial resource strain: Not on file  . Food insecurity:    Worry: Not on file    Inability: Not on file  . Transportation needs:    Medical: Not on file    Non-medical: Not on file  Tobacco Use  . Smoking status: Never Smoker  . Smokeless tobacco: Never Used  Substance and Sexual Activity  . Alcohol use: No  . Drug use: No  . Sexual activity: Yes    Birth control/protection: Pill  Lifestyle  . Physical activity:    Days per week: Not on file    Minutes per session: Not on file  . Stress: Not on file  Relationships  . Social connections:    Talks on phone: Not on file    Gets together: Not on file    Attends religious service: Not on file    Active member of club or  organization: Not on file    Attends meetings of clubs or organizations: Not on file    Relationship status: Not on file  . Intimate partner violence:    Fear of current or ex partner: Not on file    Emotionally abused: Not on file    Physically abused: Not on file    Forced sexual activity: Not on file  Other Topics Concern  . Not on file  Social History Narrative  . Not on file    Family History  Problem Relation Age of Onset  . HIV/AIDS Mother   . Cancer Maternal Uncle        lung  . Diabetes Maternal Grandmother     The following portions of the patient's history were reviewed and updated as appropriate: allergies, current medications, past family history, past medical history, past social history, past surgical history and problem list.  Review of Systems  ROS negative except as noted above. Information obtained from patient.   Objective:   BP 118/67   Pulse 77   Ht 5\' 2"  (1.575 m)   Wt 136 lb 1 oz (61.7 kg)   LMP 11/25/2018 (Approximate)   BMI 24.89 kg/m    CONSTITUTIONAL: Well-developed, well-nourished female  in no acute distress.   Recent Results (from the past 2160 hour(s))  POCT urine pregnancy     Status: Abnormal   Collection Time: 01/09/19  2:26 PM  Result Value Ref Range   Preg Test, Ur Positive (A) Negative    Assessment:   1. Amenorrhea - POCT urine pregnancy   Plan:   First trimester education and A Women's Choice handout provided, see AVS.   Prenatal samples given.   Reviewed red flag symptoms and when to call.   May return for prenatal care if desired, see disposition.    Gunnar Bulla, CNM [facility], [facility] 01/09/19 3:05 PM

## 2019-01-09 NOTE — Patient Instructions (Signed)
Vaginal Bleeding During Pregnancy, First Trimester  A small amount of bleeding (spotting) from the vagina is common during early pregnancy. Sometimes the bleeding is normal and does not cause problems. At other times, though, bleeding may be a sign of something serious. Tell your doctor about any bleeding from your vagina right away. Follow these instructions at home: Activity  Follow your doctor's instructions about how active you can be.  If needed, make plans for someone to help with your normal activities.  Do not have sex or orgasms until your doctor says that this is safe. General instructions  Take over-the-counter and prescription medicines only as told by your doctor.  Watch your condition for any changes.  Write down: ? The number of pads you use each day. ? How often you change pads. ? How soaked (saturated) your pads are.  Do not use tampons.  Do not douche.  If you pass any tissue from your vagina, save it to show to your doctor.  Keep all follow-up visits as told by your doctor. This is important. Contact a doctor if:  You have vaginal bleeding at any time while you are pregnant.  You have cramps.  You have a fever. Get help right away if:  You have very bad cramps in your back or belly (abdomen).  You pass large clots or a lot of tissue from your vagina.  Your bleeding gets worse.  You feel light-headed.  You feel weak.  You pass out (faint).  You have chills.  You are leaking fluid from your vagina.  You have a gush of fluid from your vagina. Summary  Sometimes vaginal bleeding during pregnancy is normal and does not cause problems. At other times, bleeding may be a sign of something serious.  Tell your doctor about any bleeding from your vagina right away.  Follow your doctor's instructions about how active you can be. You may need someone to help you with your normal activities. This information is not intended to replace advice given to  you by your health care provider. Make sure you discuss any questions you have with your health care provider. Document Released: 01/18/2014 Document Revised: 12/05/2016 Document Reviewed: 12/05/2016 Elsevier Interactive Patient Education  2019 [employer]. Common Medications Safe in Pregnancy  Acne:      Constipation:  Benzoyl Peroxide     Colace  Clindamycin      Dulcolax Suppository  Topica Erythromycin     Fibercon  Salicylic Acid      Metamucil         Miralax AVOID:        Senakot   Accutane    Cough:  Retin-A       Cough Drops  Tetracycline      Phenergan w/ Codeine if Rx  Minocycline      Robitussin (Plain & DM)  Antibiotics:     Crabs/Lice:  Ceclor       RID  Cephalosporins    AVOID:  E-Mycins      Kwell  Keflex  Macrobid/Macrodantin   Diarrhea:  Penicillin      Kao-Pectate  Zithromax      Imodium AD         PUSH FLUIDS AVOID:       Cipro     Fever:  Tetracycline      Tylenol (Regular or Extra  Minocycline       Strength)  Levaquin      Extra Strength-Do not  Exceed 8 tabs/24 hrs Caffeine:        <227m/day (equiv. To 1 cup of coffee or  approx. 3 12 oz sodas)         Gas: Cold/Hayfever:       Gas-X  Benadryl      Mylicon  Claritin       Phazyme  **Claritin-D        Chlor-Trimeton    Headaches:  Dimetapp      ASA-Free Excedrin  Drixoral-Non-Drowsy     Cold Compress  Mucinex (Guaifenasin)     Tylenol (Regular or Extra  Sudafed/Sudafed-12 Hour     Strength)  **Sudafed PE Pseudoephedrine   Tylenol Cold & Sinus     Vicks Vapor Rub  Zyrtec  **AVOID if Problems With Blood Pressure         Heartburn: Avoid lying down for at least 1 hour after meals  Aciphex      Maalox     Rash:  Milk of Magnesia     Benadryl    Mylanta       1% Hydrocortisone Cream  Pepcid  Pepcid Complete   Sleep Aids:  Prevacid      Ambien   Prilosec       Benadryl  Rolaids       Chamomile Tea  Tums (Limit 4/day)     Unisom  Zantac       Tylenol PM         Warm  milk-add vanilla or  Hemorrhoids:       Sugar for taste  Anusol/Anusol H.C.  (RX: Analapram 2.5%)  Sugar Substitutes:  Hydrocortisone OTC     Ok in moderation  Preparation H      Tucks        Vaseline lotion applied to tissue with wiping    Herpes:     Throat:  Acyclovir      Oragel  Famvir  Valtrex     Vaccines:         Flu Shot Leg Cramps:       *Gardasil  Benadryl      Hepatitis A         Hepatitis B Nasal Spray:       Pneumovax  Saline Nasal Spray     Polio Booster         Tetanus Nausea:       Tuberculosis test or PPD  Vitamin B6 25 mg TID   AVOID:    Dramamine      *Gardasil  Emetrol       Live Poliovirus  Ginger Root 250 mg QID    MMR (measles, mumps &  High Complex Carbs @ Bedtime    rebella)  Sea Bands-Accupressure    Varicella (Chickenpox)  Unisom 1/2 tab TID     *No known complications           If received before Pain:         Known pregnancy;   Darvocet       Resume series after  Lortab        Delivery  Percocet    Yeast:   Tramadol      Femstat  Tylenol 3      Gyne-lotrimin  Ultram       Monistat  Vicodin           MISC:         All Sunscreens  Hair Coloring/highlights          Insect Repellant's          (Including DEET)         [employer] Tans Eating Plan for Pregnant Women While you are pregnant, your body requires additional nutrition to help support your growing baby. You also have a higher need for some vitamins and minerals, such as folic acid, calcium, iron, and vitamin D. Eating a healthy, well-balanced diet is very important for your health and your baby's health. Your need for extra calories varies for the three 62-monthsegments of your pregnancy (trimesters). For most women, it is recommended to consume:  150 extra calories a day during the first trimester.  300 extra calories a day during the second trimester.  300 extra calories a day during the third trimester. What are tips for following this plan?   Do not try to lose weight or  go on a diet during pregnancy.  Limit your overall intake of foods that have "empty calories." These are foods that have little nutritional value, such as sweets, desserts, candies, and sugar-sweetened beverages.  Eat a variety of foods (especially fruits and vegetables) to get a full range of vitamins and minerals.  Take a prenatal vitamin to help meet your additional vitamin and mineral needs during pregnancy, specifically for folic acid, iron, calcium, and vitamin D.  Remember to stay active. Ask your health care provider what types of exercise and activities are safe for you.  Practice good food safety and cleanliness. Wash your hands before you eat and after you prepare raw meat. Wash all fruits and vegetables well before peeling or eating. Taking these actions can help to prevent food-borne illnesses that can be very dangerous to your baby, such as listeriosis. Ask your health care provider for more information about listeriosis. What does 150 extra calories look like? Healthy options that provide 150 extra calories each day could be any of the following:  6-8 oz (170-230 g) of plain low-fat yogurt with  cup of berries.  1 apple with 2 teaspoons (11 g) of peanut butter.  Cut-up vegetables with  cup (60 g) of hummus.  8 oz (230 mL) or 1 cup of low-fat chocolate milk.  1 stick of string cheese with 1 medium orange.  1 peanut butter and jelly sandwich that is made with one slice of whole-wheat bread and 1 tsp (5 g) of peanut butter. For 300 extra calories, you could eat two of those healthy options each day. What is a healthy amount of weight to gain? The right amount of weight gain for you is based on your BMI before you became pregnant. If your BMI:  Was less than 18 (underweight), you should gain 28-40 lb (13-18 kg).  Was 18-24.9 (normal), you should gain 25-35 lb (11-16 kg).  Was 25-29.9 (overweight), you should gain 15-25 lb (7-11 kg).  Was 30 or greater (obese), you  should gain 11-20 lb (5-9 kg). What if I am having twins or multiples? Generally, if you are carrying twins or multiples:  You may need to eat 300-600 extra calories a day.  The recommended range for total weight gain is 25-54 lb (11-25 kg), depending on your BMI before pregnancy.  Talk with your health care provider to find out about nutritional needs, weight gain, and exercise that is right for you. What foods can I eat?  Grains All grains. Choose whole grains, such as whole-wheat bread, oatmeal, or brown rice. Vegetables All  vegetables. Eat a variety of colors and types of vegetables. Remember to wash your vegetables well before peeling or eating. Fruits All fruits. Eat a variety of colors and types of fruit. Remember to wash your fruits well before peeling or eating. Meats and other protein foods Lean meats, including chicken, [place], fish, and lean cuts of beef, veal, or pork. If you eat fish or seafood, choose options that are higher in omega-3 fatty acids and lower in mercury, such as salmon, herring, mussels, trout, sardines, pollock, shrimp, crab, and lobster. Tofu. Tempeh. Beans. Eggs. Peanut butter and other nut butters. Make sure that all meats, poultry, and eggs are cooked to food-safe temperatures or "well-done." Two or more servings of fish are recommended each week in order to get the most benefits from omega-3 fatty acids that are found in seafood. Choose fish that are lower in mercury. You can find more information online:  [employer] Dairy Pasteurized milk and milk alternatives (such as almond milk). Pasteurized yogurt and pasteurized cheese. Cottage cheese. Sour cream. Beverages Water. Juices that contain 100% fruit juice or vegetable juice. Caffeine-free teas and decaffeinated coffee. Drinks that contain caffeine are okay to drink, but it is better to avoid caffeine. Keep your total caffeine intake to less than 200 mg each day (which is 12 oz or 355 mL of coffee, tea,  or soda) or the limit as told by your health care provider. Fats and oils Fats and oils are okay to include in moderation. Sweets and desserts Sweets and desserts are okay to include in moderation. Seasoning and other foods All pasteurized condiments. The items listed above may not be a complete list of recommended foods and beverages. Contact your dietitian for more options. What foods are not recommended? Vegetables Raw (unpasteurized) vegetable juices. Fruits Unpasteurized fruit juices. Meats and other protein foods Lunch meats, bologna, hot dogs, or other deli meats. (If you must eat those meats, reheat them until they are steaming hot.) Refrigerated pat, meat spreads from a meat counter, smoked seafood that is found in the refrigerated section of a store. Raw or undercooked meats, poultry, and eggs. Raw fish, such as sushi or sashimi. Fish that have high mercury content, such as tilefish, shark, swordfish, and king mackerel. To learn more about mercury in fish, talk with your health care provider or look for online resources, such as:  [employer]ch Dairy Raw (unpasteurized) milk and any foods that have raw milk in them. Soft cheeses, such as feta, queso blanco, queso fresco, Brie, Camembert cheeses, blue-veined cheeses, and Panela cheese (unless it is made with pasteurized milk, which must be stated on the label). Beverages Alcohol. Sugar-sweetened beverages, such as sodas, teas, or energy drinks. Seasoning and other foods Homemade fermented foods and drinks, such as pickles, sauerkraut, or kombucha drinks. (Store-bought pasteurized versions of these are okay.) Salads that are made in a store or deli, such as ham salad, chicken salad, egg salad, tuna salad, and seafood salad. The items listed above may not be a complete list of foods and beverages to avoid. Contact your dietitian for more information. Where to find more information To calculate the number of calories you need based on  your height, weight, and activity level, you can use an online calculator such as:  MobileTransition.ch To calculate how much weight you should gain during pregnancy, you can use an online pregnancy weight gain calculator such as:  StreamingFood.com.cy Summary  While you are pregnant, your body requires additional nutrition to help support your growing baby.  Eat a variety of foods, especially fruits and vegetables to get a full range of vitamins and minerals.  Practice good food safety and cleanliness. Wash your hands before you eat and after you prepare raw meat. Wash all fruits and vegetables well before peeling or eating. Taking these actions can help to prevent food-borne illnesses, such as listeriosis, that can be very dangerous to your baby.  Do not eat raw meat or fish. Do not eat fish that have high mercury content, such as tilefish, shark, swordfish, and king mackerel. Do not eat unpasteurized (raw) dairy.  Take a prenatal vitamin to help meet your additional vitamin and mineral needs during pregnancy, specifically for folic acid, iron, calcium, and vitamin D. This information is not intended to replace advice given to you by your health care provider. Make sure you discuss any questions you have with your health care provider. Document Released: 06/18/2014 Document Revised: 05/31/2017 Document Reviewed: 05/31/2017 Elsevier Interactive Patient Education  2019 [place] of Pregnancy  The first trimester of pregnancy is from week 1 until the end of week 13 (months 1 through 3). During this time, your baby will begin to develop inside you. At 6-8 weeks, the eyes and face are formed, and the heartbeat can be seen on ultrasound. At the end of 12 weeks, all the baby's organs are formed. Prenatal care is all the medical care you receive before the birth of your baby. Make sure you get good prenatal care and follow all of  your doctor's instructions. Follow these instructions at home: Medicines  Take over-the-counter and prescription medicines only as told by your doctor. Some medicines are safe and some medicines are not safe during pregnancy.  Take a prenatal vitamin that contains at least 600 micrograms (mcg) of folic acid.  If you have trouble pooping (constipation), take medicine that will make your stool soft (stool softener) if your doctor approves. Eating and drinking   Eat regular, healthy meals.  Your doctor will tell you the amount of weight gain that is right for you.  Avoid raw meat and uncooked cheese.  If you feel sick to your stomach (nauseous) or throw up (vomit): ? Eat 4 or 5 small meals a day instead of 3 large meals. ? Try eating a few soda crackers. ? Drink liquids between meals instead of during meals.  To prevent constipation: ? Eat foods that are high in fiber, like fresh fruits and vegetables, whole grains, and beans. ? Drink enough fluids to keep your pee (urine) clear or pale yellow. Activity  Exercise only as told by your doctor. Stop exercising if you have cramps or pain in your lower belly (abdomen) or low back.  Do not exercise if it is too hot, too humid, or if you are in a place of great height (high altitude).  Try to avoid standing for long periods of time. Move your legs often if you must stand in one place for a long time.  Avoid heavy lifting.  Wear low-heeled shoes. Sit and stand up straight.  You can have sex unless your doctor tells you not to. Relieving pain and discomfort  Wear a good support bra if your breasts are sore.  Take warm water baths (sitz baths) to soothe pain or discomfort caused by hemorrhoids. Use hemorrhoid cream if your doctor says it is okay.  Rest with your legs raised if you have leg cramps or low back pain.  If you have puffy, bulging veins (varicose veins)  in your legs: ? Wear support hose or compression stockings as told  by your doctor. ? Raise (elevate) your feet for 15 minutes, 3-4 times a day. ? Limit salt in your food. Prenatal care  Schedule your prenatal visits by the twelfth week of pregnancy.  Write down your questions. Take them to your prenatal visits.  Keep all your prenatal visits as told by your doctor. This is important. Safety  Wear your seat belt at all times when driving.  Make a list of emergency phone numbers. The list should include numbers for family, friends, the hospital, and police and fire departments. General instructions  Ask your doctor for a referral to a local prenatal class. Begin classes no later than at the start of month 6 of your pregnancy.  Ask for help if you need counseling or if you need help with nutrition. Your doctor can give you advice or tell you where to go for help.  Do not use hot tubs, steam rooms, or saunas.  Do not douche or use tampons or scented sanitary pads.  Do not cross your legs for long periods of time.  Avoid all herbs and alcohol. Avoid drugs that are not approved by your doctor.  Do not use any tobacco products, including cigarettes, chewing tobacco, and electronic cigarettes. If you need help quitting, ask your doctor. You may get counseling or other support to help you quit.  Avoid cat litter boxes and soil used by cats. These carry germs that can cause birth defects in the baby and can cause a loss of your baby (miscarriage) or stillbirth.  Visit your dentist. At home, brush your teeth with a soft toothbrush. Be gentle when you floss. Contact a doctor if:  You are dizzy.  You have mild cramps or pressure in your lower belly.  You have a nagging pain in your belly area.  You continue to feel sick to your stomach, you throw up, or you have watery poop (diarrhea).  You have a bad smelling fluid coming from your vagina.  You have pain when you pee (urinate).  You have increased puffiness (swelling) in your face, hands, legs,  or ankles. Get help right away if:  You have a fever.  You are leaking fluid from your vagina.  You have spotting or bleeding from your vagina.  You have very bad belly cramping or pain.  You gain or lose weight rapidly.  You throw up blood. It may look like coffee grounds.  You are around people who have [place] measles, fifth disease, or chickenpox.  You have a very bad headache.  You have shortness of breath.  You have any kind of trauma, such as from a fall or a car accident. Summary  The first trimester of pregnancy is from week 1 until the end of week 13 (months 1 through 3).  To take care of yourself and your unborn baby, you will need to eat healthy meals, take medicines only if your doctor tells you to do so, and do activities that are safe for you and your baby.  Keep all follow-up visits as told by your doctor. This is important as your doctor will have to ensure that your baby is healthy and growing well. This information is not intended to replace advice given to you by your health care provider. Make sure you discuss any questions you have with your health care provider. Document Released: 02/20/2008 Document Revised: 09/11/2016 Document Reviewed: 09/11/2016 Elsevier Interactive Patient Education  2019 [place]. How a Baby Grows During Pregnancy  Pregnancy begins when a female's sperm enters a female's egg (fertilization). Fertilization usually happens in one of the tubes (fallopian tubes) that connect the ovaries to the womb (uterus). The fertilized egg moves down the fallopian tube to the uterus. Once it reaches the uterus, it implants into the lining of the uterus and begins to grow. For the first 10 weeks, the fertilized egg is called an embryo. After 10 weeks, it is called a fetus. As the fetus continues to grow, it receives oxygen and nutrients through tissue (placenta) that grows to support the developing baby. The placenta is the life support system for the  baby. It provides oxygen and nutrition and removes waste. Learning as much as you can about your pregnancy and how your baby is developing can help you enjoy the experience. It can also make you aware of when there might be a problem and when to ask questions. How long does a typical pregnancy last? A pregnancy usually lasts 280 days, or about 40 weeks. Pregnancy is divided into three periods of growth, also called trimesters:  First trimester: 0-12 weeks.  Second trimester: 13-27 weeks.  Third trimester: 28-40 weeks. The day when your baby is ready to be born (full term) is your estimated date of delivery. How does my baby develop month by month? First month  The fertilized egg attaches to the inside of the uterus.  Some cells will form the placenta. Others will form the fetus.  The arms, legs, brain, spinal cord, lungs, and heart begin to develop.  At the end of the first month, the heart begins to beat. Second month  The bones, inner ear, eyelids, hands, and feet form.  The genitals develop.  By the end of 8 weeks, all major organs are developing. Third month  All of the internal organs are forming.  Teeth develop below the gums.  Bones and muscles begin to grow. The spine can flex.  The skin is transparent.  Fingernails and toenails begin to form.  Arms and legs continue to grow longer, and hands and feet develop.  The fetus is about 3 inches (7.6 cm) long. Fourth month  The placenta is completely formed.  The external sex organs, neck, outer ear, eyebrows, eyelids, and fingernails are formed.  The fetus can hear, swallow, and move its arms and legs.  The kidneys begin to produce urine.  The skin is covered with a white, waxy coating (vernix) and very fine hair (lanugo). Fifth month  The fetus moves around more and can be felt for the first time (quickening).  The fetus starts to sleep and wake up and may begin to suck its finger.  The nails grow to the  end of the fingers.  The organ in the digestive system that makes bile (gallbladder) functions and helps to digest nutrients.  If your baby is a girl, eggs are present in her ovaries. If your baby is a boy, testicles start to move down into his scrotum. Sixth month  The lungs are formed.  The eyes open. The brain continues to develop.  Your baby has fingerprints and toe prints. Your baby's hair grows thicker.  At the end of the second trimester, the fetus is about 9 inches (22.9 cm) long. Seventh month  The fetus kicks and stretches.  The eyes are developed enough to sense changes in light.  The hands can make a grasping motion.  The fetus responds to sound.  Eighth month  All organs and body systems are fully developed and functioning.  Bones harden, and taste buds develop. The fetus may hiccup.  Certain areas of the brain are still developing. The skull remains soft. Ninth month  The fetus gains about  lb (0.23 kg) each week.  The lungs are fully developed.  Patterns of sleep develop.  The fetus's head typically moves into a head-down position (vertex) in the uterus to prepare for birth.  The fetus weighs 6-9 lb (2.72-4.08 kg) and is 19-20 inches (48.26-50.8 cm) long. What can I do to have a healthy pregnancy and help my baby develop? General instructions  Take prenatal vitamins as directed by your health care provider. These include vitamins such as folic acid, iron, calcium, and vitamin D. They are important for healthy development.  Take medicines only as directed by your health care provider. Read labels and ask a pharmacist or your health care provider whether over-the-counter medicines, supplements, and prescription drugs are safe to take during pregnancy.  Keep all follow-up visits as directed by your health care provider. This is important. Follow-up visits include prenatal care and screening tests. How do I know if my baby is developing well? At each  prenatal visit, your health care provider will do several different tests to check on your health and keep track of your baby's development. These include:  Fundal height and position. ? Your health care provider will measure your growing belly from your pubic bone to the top of the uterus using a tape measure. ? Your health care provider will also feel your belly to determine your baby's position.  Heartbeat. ? An ultrasound in the first trimester can confirm pregnancy and show a heartbeat, depending on how far along you are. ? Your health care provider will check your baby's heart rate at every prenatal visit.  Second trimester ultrasound. ? This ultrasound checks your baby's development. It also may show your baby's gender. What should I do if I have concerns about my baby's development? Always talk with your health care provider about any concerns that you may have about your pregnancy and your baby. Summary  A pregnancy usually lasts 280 days, or about 40 weeks. Pregnancy is divided into three periods of growth, also called trimesters.  Your health care provider will monitor your baby's growth and development throughout your pregnancy.  Follow your health care provider's recommendations about taking prenatal vitamins and medicines during your pregnancy.  Talk with your health care provider if you have any concerns about your pregnancy or your developing baby. This information is not intended to replace advice given to you by your health care provider. Make sure you discuss any questions you have with your health care provider. Document Released: 02/20/2008 Document Revised: 07/17/2017 Document Reviewed: 07/17/2017 Elsevier Interactive Patient Education  2019 [employer].

## 2019-01-26 ENCOUNTER — Telehealth: Payer: Self-pay

## 2019-01-26 NOTE — Telephone Encounter (Signed)
Coronavirus (COVID-19) Are you at risk?  Are you at risk for the Coronavirus (COVID-19)?  To be considered HIGH RISK for Coronavirus (COVID-19), you have to meet the following criteria:  . Traveled to [place], [place], [place], [place] or [place]; or in the [place] to [place], [place], [place], or [place]; and have fever, cough, and shortness of breath within the last 2 weeks of travel OR . Been in close contact with a person diagnosed with COVID-19 within the last 2 weeks and have fever, cough, and shortness of breath . IF YOU DO NOT MEET THESE CRITERIA, YOU ARE CONSIDERED LOW RISK FOR COVID-19.  What to do if you are HIGH RISK for COVID-19?  . If you are having a medical emergency, call 911. . Seek medical care right away. Before you go to a doctor's office, urgent care or emergency department, call ahead and tell them about your recent travel, contact with someone diagnosed with COVID-19, and your symptoms. You should receive instructions from your physician's office regarding next steps of care.  . When you arrive at healthcare provider, tell the healthcare staff immediately you have returned from visiting [place], [place], [place], [place] or [place]; or traveled in the [place] to [place], [place], [place], or [place]; in the last two weeks or you have been in close contact with a person diagnosed with COVID-19 in the last 2 weeks.   . Tell the health care staff about your symptoms: fever, cough and shortness of breath. . After you have been seen by a medical provider, you will be either: o Tested for (COVID-19) and discharged home on quarantine except to seek medical care if symptoms worsen, and asked to  - Stay home and avoid contact with others until you get your results (4-5 days)  - Avoid travel on public transportation if possible (such as bus, train, or airplane) or o Sent to the Emergency Department by EMS for evaluation, COVID-19 testing, and possible  admission depending on your condition and test results.  What to do if you are LOW RISK for COVID-19?  Reduce your risk of any infection by using the same precautions used for avoiding the common cold or flu:  . Wash your hands often with soap and warm water for at least 20 seconds.  If soap and water are not readily available, use an alcohol-based hand sanitizer with at least 60% alcohol.  . If coughing or sneezing, cover your mouth and nose by coughing or sneezing into the elbow areas of your shirt or coat, into a tissue or into your sleeve (not your hands). . Avoid shaking hands with others and consider head nods or verbal greetings only. . Avoid touching your eyes, nose, or mouth with unwashed hands.  . Avoid close contact with people who are sick. . Avoid places or events with large numbers of people in one location, like concerts or sporting events. . Carefully consider travel plans you have or are making. . If you are planning any travel outside or inside the [place], visit the CDC's Travelers' Health webpage for the latest health notices. . If you have some symptoms but not all symptoms, continue to monitor at home and seek medical attention if your symptoms worsen. . If you are having a medical emergency, call 911.   ADDITIONAL HEALTHCARE OPTIONS FOR PATIENTS  [facility] / e-Visit: [email or web address]         [facility]: [phone]  [facility]: [phone]                   [facility]: [phone]  Prescreened. cm 

## 2019-01-27 ENCOUNTER — Ambulatory Visit (INDEPENDENT_AMBULATORY_CARE_PROVIDER_SITE_OTHER): Payer: Self-pay

## 2019-01-27 ENCOUNTER — Other Ambulatory Visit: Payer: Self-pay

## 2019-01-27 ENCOUNTER — Other Ambulatory Visit: Payer: Self-pay | Admitting: Obstetrics and Gynecology

## 2019-01-27 DIAGNOSIS — Z3491 Encounter for supervision of normal pregnancy, unspecified, first trimester: Secondary | ICD-10-CM

## 2019-01-27 DIAGNOSIS — O3481 Maternal care for other abnormalities of pelvic organs, first trimester: Secondary | ICD-10-CM

## 2019-01-27 DIAGNOSIS — N8311 Corpus luteum cyst of right ovary: Secondary | ICD-10-CM

## 2019-01-27 DIAGNOSIS — Z3A10 10 weeks gestation of pregnancy: Secondary | ICD-10-CM

## 2019-02-01 IMAGING — CR DG CHEST 2V
1 series · 2 of 2 positions shown · non-contrast
Comparison: None.

CLINICAL DATA: Pt states she noticed a lump posterior right side
under last rib since [REDACTED]. Pain in that area, no other
complaints. No prior cardiac history.

EXAM:
CHEST  2 VIEW

[Series 1: dg chest 2 view · 0.14mm/px · 2 of 2 slices shown]
[im 1/2]
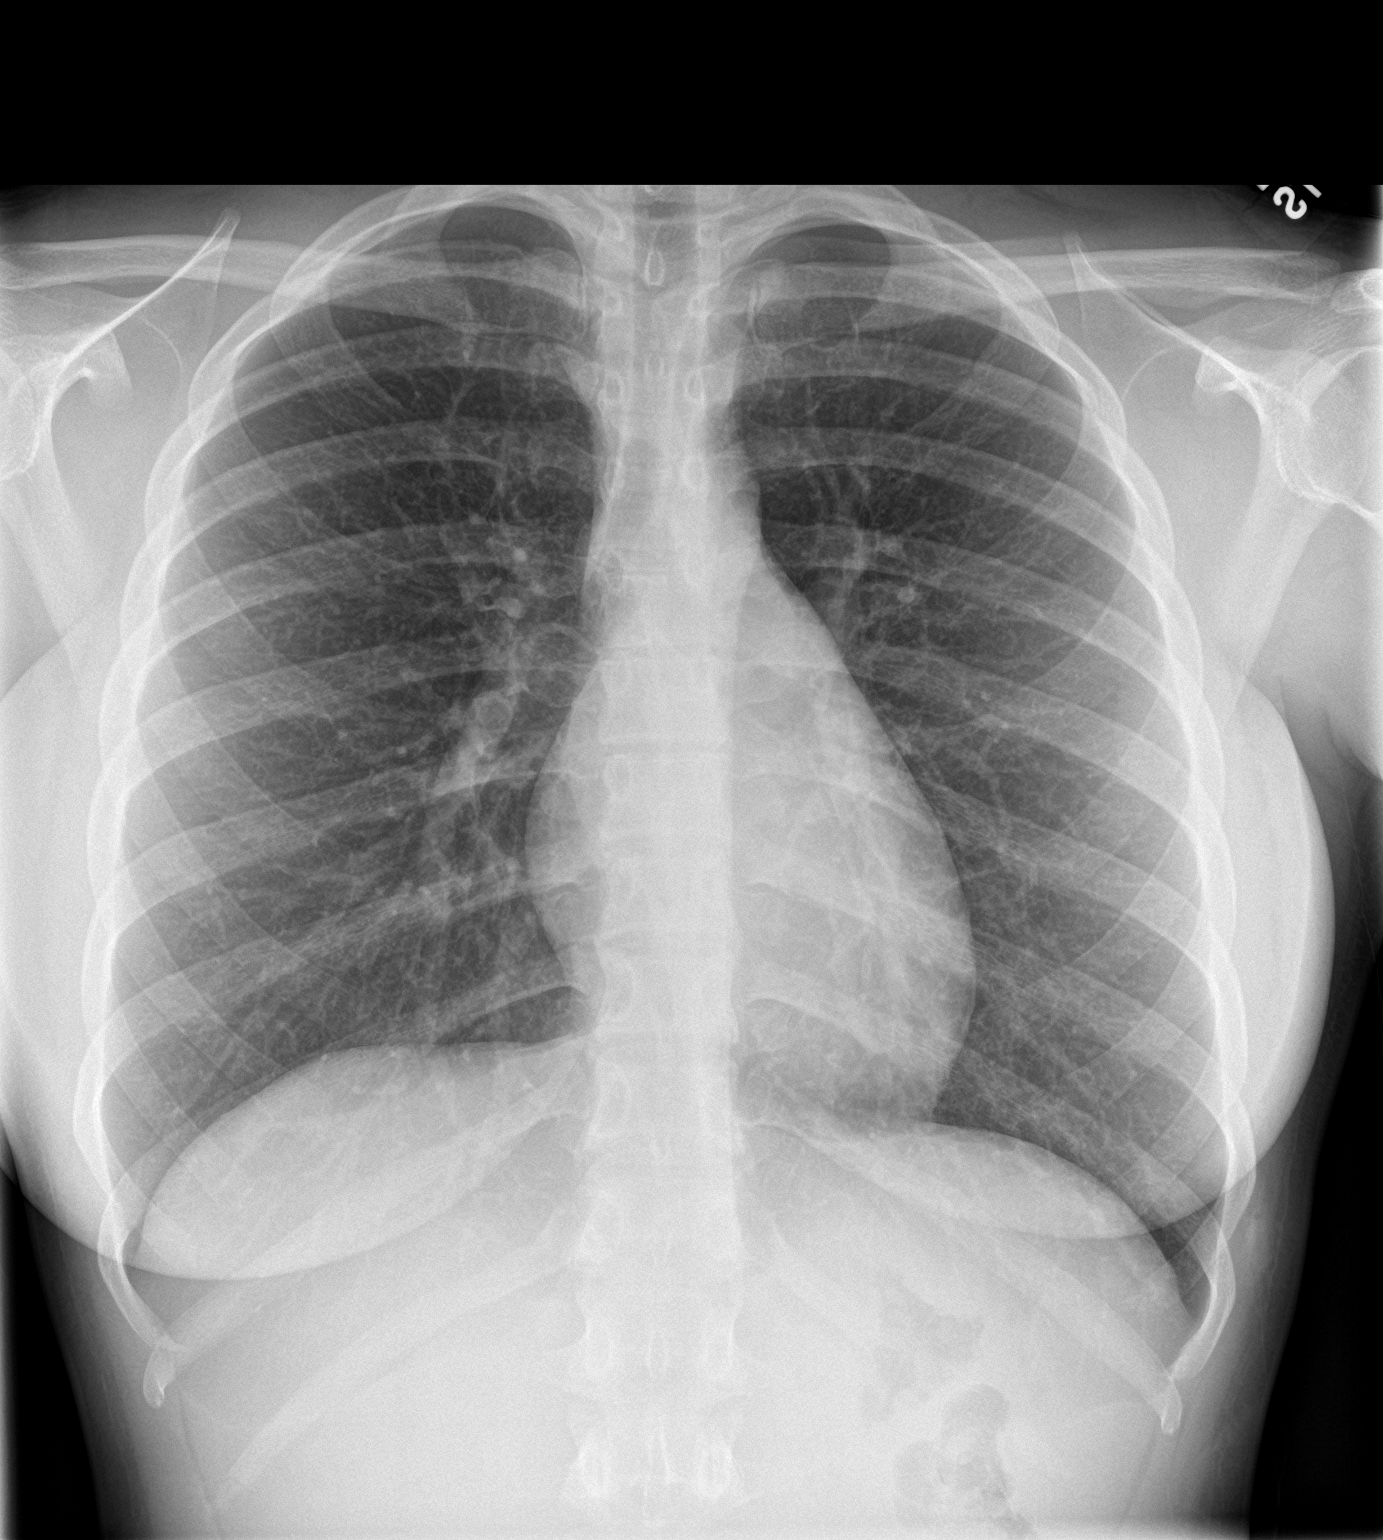
[im 2/2]
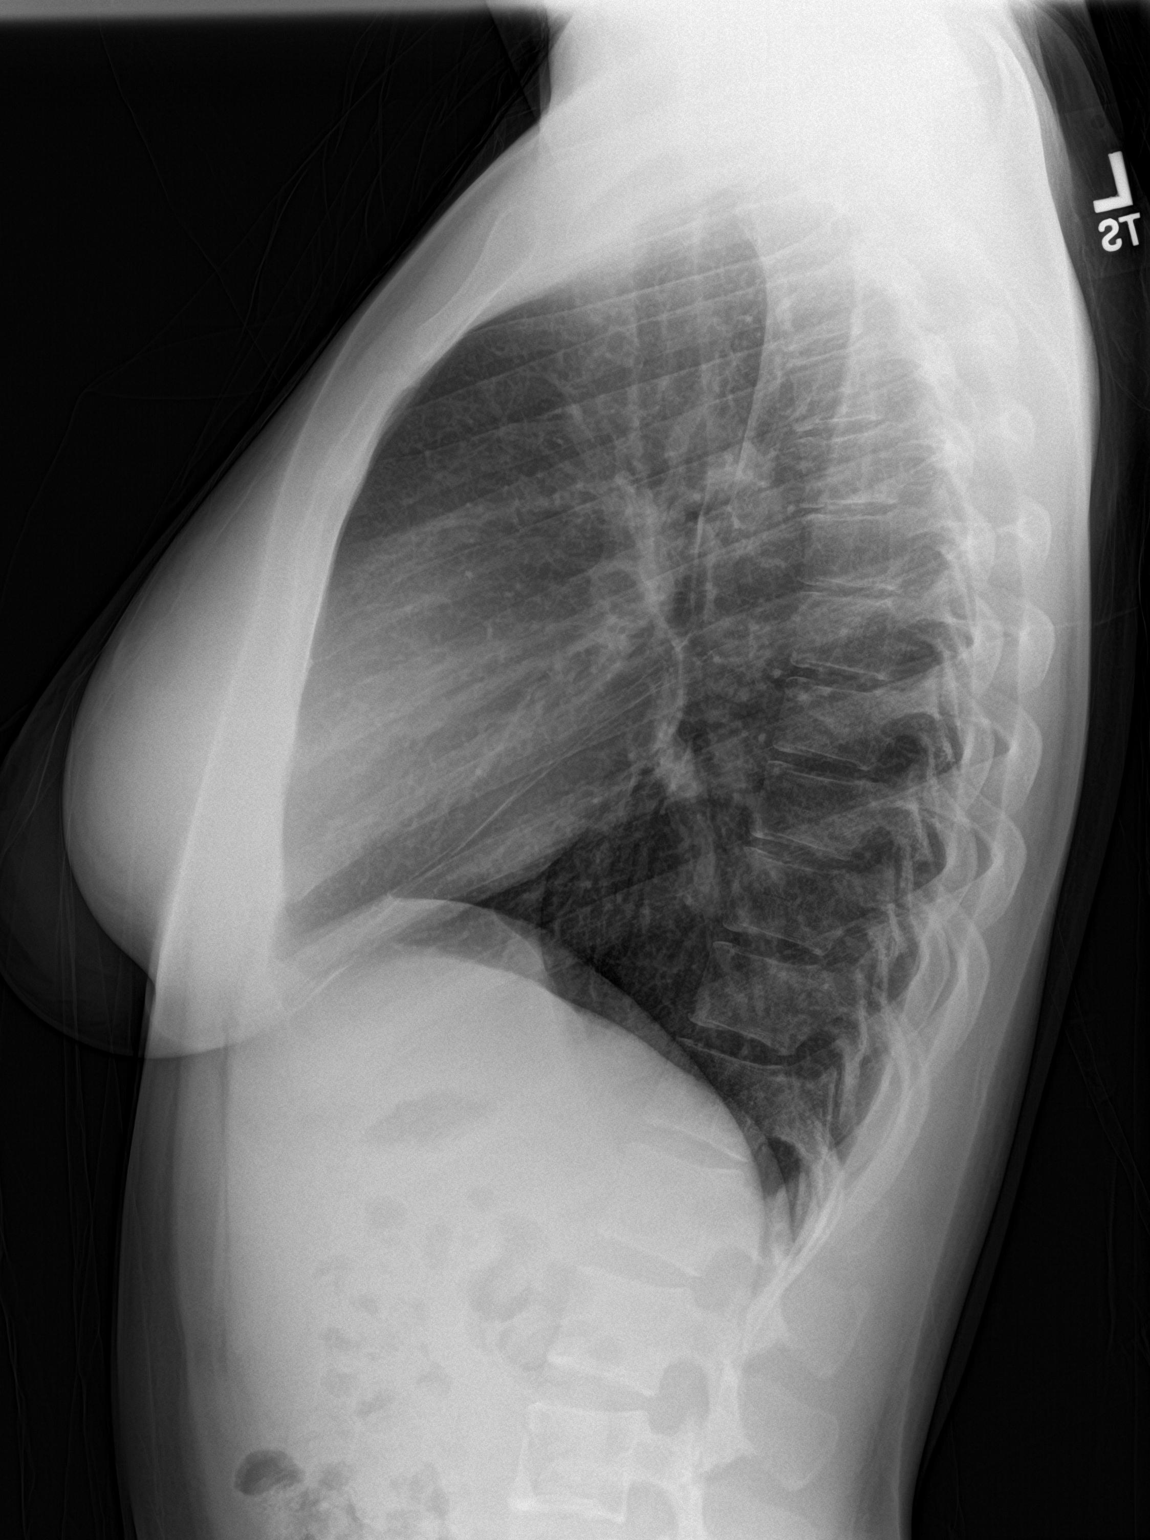

[2 of 2 positions shown; findings below may reference images not displayed]

FINDINGS: Normal heart, mediastinum and hila.

The lungs are clear.  No pleural effusion or pneumothorax.

The skeletal structures and surrounding soft tissues are within
normal limits.
IMPRESSION: Normal chest radiographs.

## 2019-02-19 ENCOUNTER — Telehealth: Payer: Self-pay

## 2019-02-19 NOTE — Telephone Encounter (Signed)
Coronavirus (COVID-19) Are you at risk?  Are you at risk for the Coronavirus (COVID-19)?  To be considered HIGH RISK for Coronavirus (COVID-19), you have to meet the following criteria:  . Traveled to [place], [place], [place], [place] or [place]; or in the [place] to [place], [place], [place], or [place]; and have fever, cough, and shortness of breath within the last 2 weeks of travel OR . Been in close contact with a person diagnosed with COVID-19 within the last 2 weeks and have fever, cough, and shortness of breath . IF YOU DO NOT MEET THESE CRITERIA, YOU ARE CONSIDERED LOW RISK FOR COVID-19.  What to do if you are HIGH RISK for COVID-19?  . If you are having a medical emergency, call 911. . Seek medical care right away. Before you go to a doctor's office, urgent care or emergency department, call ahead and tell them about your recent travel, contact with someone diagnosed with COVID-19, and your symptoms. You should receive instructions from your physician's office regarding next steps of care.  . When you arrive at healthcare provider, tell the healthcare staff immediately you have returned from visiting [place], [place], [place], [place] or [place]; or traveled in the [place] to [place], [place], [place], or [place]; in the last two weeks or you have been in close contact with a person diagnosed with COVID-19 in the last 2 weeks.   . Tell the health care staff about your symptoms: fever, cough and shortness of breath. . After you have been seen by a medical provider, you will be either: o Tested for (COVID-19) and discharged home on quarantine except to seek medical care if symptoms worsen, and asked to  - Stay home and avoid contact with others until you get your results (4-5 days)  - Avoid travel on public transportation if possible (such as bus, train, or airplane) or o Sent to the Emergency Department by EMS for evaluation, COVID-19 testing, and possible  admission depending on your condition and test results.  What to do if you are LOW RISK for COVID-19?  Reduce your risk of any infection by using the same precautions used for avoiding the common cold or flu:  . Wash your hands often with soap and warm water for at least 20 seconds.  If soap and water are not readily available, use an alcohol-based hand sanitizer with at least 60% alcohol.  . If coughing or sneezing, cover your mouth and nose by coughing or sneezing into the elbow areas of your shirt or coat, into a tissue or into your sleeve (not your hands). . Avoid shaking hands with others and consider head nods or verbal greetings only. . Avoid touching your eyes, nose, or mouth with unwashed hands.  . Avoid close contact with people who are sick. . Avoid places or events with large numbers of people in one location, like concerts or sporting events. . Carefully consider travel plans you have or are making. . If you are planning any travel outside or inside the [place], visit the CDC's Travelers' Health webpage for the latest health notices. . If you have some symptoms but not all symptoms, continue to monitor at home and seek medical attention if your symptoms worsen. . If you are having a medical emergency, call 911.   ADDITIONAL HEALTHCARE OPTIONS FOR PATIENTS  [facility] / e-Visit: [email or web address]         [facility]: [phone]  [facility]: [phone]                   [facility]: [phone]   Pre-screen negative, DM.   

## 2019-02-20 ENCOUNTER — Ambulatory Visit (INDEPENDENT_AMBULATORY_CARE_PROVIDER_SITE_OTHER): Payer: Medicaid Other | Admitting: Certified Nurse Midwife

## 2019-02-20 ENCOUNTER — Other Ambulatory Visit: Payer: Self-pay

## 2019-02-20 ENCOUNTER — Encounter: Payer: Self-pay | Admitting: Certified Nurse Midwife

## 2019-02-20 ENCOUNTER — Other Ambulatory Visit (HOSPITAL_COMMUNITY)
Admission: RE | Admit: 2019-02-20 | Discharge: 2019-02-20 | Disposition: A | Discharge status: Home / Self Care | Payer: Medicaid Other | Source: Ambulatory Visit | Attending: Certified Nurse Midwife | Admitting: Certified Nurse Midwife

## 2019-02-20 VITALS — BP 110/66 | HR 73 | Wt 141.5 lb

## 2019-02-20 DIAGNOSIS — Z3A13 13 weeks gestation of pregnancy: Secondary | ICD-10-CM

## 2019-02-20 DIAGNOSIS — Z3492 Encounter for supervision of normal pregnancy, unspecified, second trimester: Secondary | ICD-10-CM

## 2019-02-20 DIAGNOSIS — Z3401 Encounter for supervision of normal first pregnancy, first trimester: Secondary | ICD-10-CM

## 2019-02-20 DIAGNOSIS — Z124 Encounter for screening for malignant neoplasm of cervix: Secondary | ICD-10-CM | POA: Insufficient documentation

## 2019-02-20 DIAGNOSIS — Z0283 Encounter for blood-alcohol and blood-drug test: Secondary | ICD-10-CM

## 2019-02-20 DIAGNOSIS — Z113 Encounter for screening for infections with a predominantly sexual mode of transmission: Secondary | ICD-10-CM

## 2019-02-20 NOTE — Progress Notes (Signed)
NEW OB HISTORY AND PHYSICAL  SUBJECTIVE:       Suzanne Nixon is a 23 y.o. G63P0000 female, Patient's last menstrual period was 11/25/2018 (approximate)., Estimated Date of Delivery: 08/23/19, [redacted]w[redacted]d, presents today for establishment of Prenatal Care.  Reports breast tenderness and resolved nausea. Taking prenatal vitamin with folic acid and DHA. Excited about pregnancy. Desires genetic screening. Prefers midwifery care.   Denies difficulty breathing or respiratory distress, chest pain, abdominal pain, vaginal bleeding, dysuria, and leg pain or swelling.    Gynecologic History  Patient's last menstrual period was 11/25/2018 (approximate).   Contraception: none  Last Pap: due.  Obstetric History OB History  Gravida Para Term Preterm AB Living  1 0 0 0 0 0  SAB TAB Ectopic Multiple Live Births  0 0 0 0 0    # Outcome Date GA Lbr Len/2nd Weight Sex Delivery Anes PTL Lv  1 Current             Past Medical History:  Diagnosis Date  . Anxiety   . Finding of floating rib   . Heavy periods   . Pelvic pain     No past surgical history on file.  Current Outpatient Medications on File Prior to Visit  Medication Sig Dispense Refill  . Prenatal Vit-Fe Fumarate-FA (MULTIVITAMIN-PRENATAL) 27-0.8 MG TABS tablet Take 1 tablet by mouth daily at 12 noon.     No current facility-administered medications on file prior to visit.     No Known Allergies  Social History   Socioeconomic History  . Marital status: Single    Spouse name: Not on file  . Number of children: Not on file  . Years of education: Not on file  . Highest education level: Not on file  Occupational History  . Not on file  Social Needs  . Financial resource strain: Not on file  . Food insecurity:    Worry: Not on file    Inability: Not on file  . Transportation needs:    Medical: Not on file    Non-medical: Not on file  Tobacco Use  . Smoking status: Never Smoker  . Smokeless tobacco: Never Used  Substance  and Sexual Activity  . Alcohol use: No  . Drug use: No  . Sexual activity: Yes    Birth control/protection: Pill  Lifestyle  . Physical activity:    Days per week: Not on file    Minutes per session: Not on file  . Stress: Not on file  Relationships  . Social connections:    Talks on phone: Not on file    Gets together: Not on file    Attends religious service: Not on file    Active member of club or organization: Not on file    Attends meetings of clubs or organizations: Not on file    Relationship status: Not on file  . Intimate partner violence:    Fear of current or ex partner: Not on file    Emotionally abused: Not on file    Physically abused: Not on file    Forced sexual activity: Not on file  Other Topics Concern  . Not on file  Social History Narrative  . Not on file    Family History  Problem Relation Age of Onset  . HIV/AIDS Mother   . Cancer Maternal Uncle        lung  . Diabetes Maternal Grandmother     The following portions of the patient's history were reviewed and  updated as appropriate: allergies, current medications, past OB history, past medical history, past surgical history, past family history, past social history, and problem list.  Review of Systems:  ROS negative except as noted above. Information obtained from patient.   OBJECTIVE:  BP 110/66   Pulse 73   Wt 141 lb 8 oz (64.2 kg)   LMP 11/25/2018 (Approximate)   BMI 25.88 kg/m   Initial Physical Exam (New OB)  GENERAL APPEARANCE: alert, well appearing, in no apparent distress  HEAD: normocephalic, atraumatic  MOUTH: mucous membranes moist, pharynx normal without lesions  THYROID: no thyromegaly or masses present  BREASTS: no masses noted, no significant tenderness, no palpable axillary nodes, no skin changes  LUNGS: clear to auscultation, no wheezes, rales or rhonchi, symmetric air entry  HEART: regular rate and rhythm, no murmurs  ABDOMEN: soft, nontender, nondistended,  no abnormal masses, no epigastric pain and FHT present  EXTREMITIES: no redness or tenderness in the calves or thighs, no edema  SKIN: normal coloration and turgor, no rashes, professional tattoos present  LYMPH NODES: no adenopathy palpable  NEUROLOGIC: alert, oriented, normal speech, no focal findings or movement disorder noted  PELVIC EXAM EXTERNAL GENITALIA: normal appearing vulva with no masses, tenderness or lesions VAGINA: no abnormal discharge or lesions CERVIX: no lesions or cervical motion tenderness and Thin prep pap collected UTERUS: gravid and consistent with 14 weeks ADNEXA: no masses palpable and nontender OB EXAM PELVIMETRY: appears adequate  ASSESSMENT: Normal pregnancy Desires genetic screening-Panorama ordered Screening cervical cancer-Pap collected  PLAN: Prenatal care New OB counseling: The patient has been given an overview regarding routine prenatal care. Recommendations regarding diet, weight gain, and exercise in pregnancy were given. Prenatal testing, optional genetic testing, and ultrasound use in pregnancy were reviewed.  Benefits of Breast Feeding were discussed. The patient is encouraged to consider nursing her baby post partum. See orders   Gunnar BullaJenkins Michelle Yoshio Seliga, CNM [facility] 02/20/19 10:44 AM

## 2019-02-20 NOTE — Progress Notes (Signed)
Suzanne Nixon presents for NOB nurse interview visit and physical.  Pregnancy confirmation done 01/09/2019 at [facility].  G-1.  P-0.  LMP 11/25/2018.  EDD 08/23/2019. Dating scan done 01/27/2019 [facility] [redacted]w[redacted]d.  Pregnancy education material explained and given.  No cats in the home .  NOB labs ordered.  TSH/HgA1C ordered due to increased BMI.There is no height or weight on file to calculate BMI.  Sickle cell ordered. HIV and drug screen were explained and ordered.  PNV encouraged.  Genetic screening options discussed.  Genetic testing: Unsure.  FMLA form signed.  Financial policy reviewed.

## 2019-02-20 NOTE — Patient Instructions (Signed)
WHAT OB PATIENTS CAN EXPECT   Confirmation of pregnancy and ultrasound ordered if medically indicated-[redacted] weeks gestation  New OB (NOB) intake with nurse and New OB (NOB) labs- [redacted] weeks gestation  New OB (NOB) physical examination with provider- 11/[redacted] weeks gestation  Flu vaccine-[redacted] weeks gestation  Anatomy scan-[redacted] weeks gestation  Glucose tolerance test, blood work to test for anemia, T-dap vaccine-[redacted] weeks gestation  Vaginal swabs/cultures-STD/Group B strep-[redacted] weeks gestation  Appointments every 4 weeks until 28 weeks  Every 2 weeks from 28 weeks until 36 weeks  Weekly visits from 36 weeks until delivery  Back Pain in Pregnancy Back pain during pregnancy is common. Back pain may be caused by several factors that are related to changes during your pregnancy. Follow these instructions at home: Managing pain, stiffness, and swelling      If directed, for sudden (acute) back pain, put ice on the painful area. ? Put ice in a plastic bag. ? Place a towel between your skin and the bag. ? Leave the ice on for 20 minutes, 2-3 times per day.  If directed, apply heat to the affected area before you exercise. Use the heat source that your health care provider recommends, such as a moist heat pack or a heating pad. ? Place a towel between your skin and the heat source. ? Leave the heat on for 20-30 minutes. ? Remove the heat if your skin turns bright red. This is especially important if you are unable to feel pain, heat, or cold. You may have a greater risk of getting burned.  If directed, massage the affected area. Activity  Exercise as told by your health care provider. Gentle exercise is the best way to prevent or manage back pain.  Listen to your body when lifting. If lifting hurts, ask for help or bend your knees. This uses your leg muscles instead of your back muscles.  Squat down when picking up something from the floor. Do not bend over.  Only use bed rest for short  periods as told by your health care provider. Bed rest should only be used for the most severe episodes of back pain. Standing, sitting, and lying down  Do not stand in one place for long periods of time.  Use good posture when sitting. Make sure your head rests over your shoulders and is not hanging forward. Use a pillow on your lower back if necessary.  Try sleeping on your side, preferably the left side, with a pregnancy support pillow or 1-2 regular pillows between your legs. ? If you have back pain after a night's rest, your bed may be too soft. ? A firm mattress may provide more support for your back during pregnancy. General instructions  Do not wear high heels.  Eat a healthy diet. Try to gain weight within your health care provider's recommendations.  Use a maternity girdle, elastic sling, or back brace as told by your health care provider.  Take over-the-counter and prescription medicines only as told by your health care provider.  Work with a physical therapist or massage therapist to find ways to manage back pain. Acupuncture or massage therapy may be helpful.  Keep all follow-up visits as told by your health care provider. This is important. Contact a health care provider if:  Your back pain interferes with your daily activities.  You have increasing pain in other parts of your body. Get help right away if:  You develop numbness, tingling, weakness, or problems with the use of  your arms or legs.  You develop severe back pain that is not controlled with medicine.  You have a change in bowel or bladder control.  You develop shortness of breath, dizziness, or you faint.  You develop nausea, vomiting, or sweating.  You have back pain that is a rhythmic, cramping pain similar to labor pains. Labor pain is usually 1-2 minutes apart, lasts for about 1 minute, and involves a bearing down feeling or pressure in your pelvis.  You have back pain and your water breaks or  you have vaginal bleeding.  You have back pain or numbness that travels down your leg.  Your back pain developed after you fell.  You develop pain on one side of your back.  You see blood in your urine.  You develop skin blisters in the area of your back pain. Summary  Back pain may be caused by several factors that are related to changes during your pregnancy.  Follow instructions as told by your health care provider for managing pain, stiffness, and swelling.  Exercise as told by your health care provider. Gentle exercise is the best way to prevent or manage back pain.  Take over-the-counter and prescription medicines only as told by your health care provider.  Keep all follow-up visits as told by your health care provider. This is important. This information is not intended to replace advice given to you by your health care provider. Make sure you discuss any questions you have with your health care provider. Document Released: 12/12/2005 Document Revised: 02/19/2018 Document Reviewed: 02/19/2018 Elsevier Interactive Patient Education  2019 [place]. Abdominal Pain During Pregnancy  Belly (abdominal) pain is common during pregnancy. There are many possible causes. Most of the time, it is not a serious problem. Other times, it can be a sign that something is wrong with the pregnancy. Always tell your doctor if you have belly pain. Follow these instructions at home:  Do not have sex or put anything in your vagina until your pain goes away completely.  Get plenty of rest until your pain gets better.  Drink enough fluid to keep your pee (urine) pale yellow.  Take over-the-counter and prescription medicines only as told by your doctor.  Keep all follow-up visits as told by your doctor. This is important. Contact a doctor if:  Your pain continues or gets worse after resting.  You have lower belly pain that: ? Comes and goes at regular times. ? Spreads to your  back. ? Feels like menstrual cramps.  You have pain or burning when you pee (urinate). Get help right away if:  You have a fever or chills.  You have vaginal bleeding.  You are leaking fluid from your vagina.  You are passing tissue from your vagina.  You throw up (vomit) for more than 24 hours.  You have watery poop (diarrhea) for more than 24 hours.  Your baby is moving less than usual.  You feel very weak or faint.  You have shortness of breath.  You have very bad pain in your upper belly. Summary  Belly (abdominal) pain is common during pregnancy. There are many possible causes.  If you have belly pain during pregnancy, tell your doctor right away.  Keep all follow-up visits as told by your doctor. This is important. This information is not intended to replace advice given to you by your health care provider. Make sure you discuss any questions you have with your health care provider. Document Released: 08/22/2009 Document Revised:  12/06/2016 Document Reviewed: 12/06/2016 Elsevier Interactive Patient Education  2019 [employer]. Common Medications Safe in Pregnancy  Acne:      Constipation:  Benzoyl Peroxide     Colace  Clindamycin      Dulcolax Suppository  Topica Erythromycin     Fibercon  Salicylic Acid      Metamucil         Miralax AVOID:        Senakot   Accutane    Cough:  Retin-A       Cough Drops  Tetracycline      Phenergan w/ Codeine if Rx  Minocycline      Robitussin (Plain & DM)  Antibiotics:     Crabs/Lice:  Ceclor       RID  Cephalosporins    AVOID:  E-Mycins      Kwell  Keflex  Macrobid/Macrodantin   Diarrhea:  Penicillin      Kao-Pectate  Zithromax      Imodium AD         PUSH FLUIDS AVOID:       Cipro     Fever:  Tetracycline      Tylenol (Regular or Extra  Minocycline       Strength)  Levaquin      Extra Strength-Do not          Exceed 8 tabs/24 hrs Caffeine:        <260m/day (equiv. To 1 cup of coffee or  approx. 3 12 oz  sodas)         Gas: Cold/Hayfever:       Gas-X  Benadryl      Mylicon  Claritin       Phazyme  **Claritin-D        Chlor-Trimeton    Headaches:  Dimetapp      ASA-Free Excedrin  Drixoral-Non-Drowsy     Cold Compress  Mucinex (Guaifenasin)     Tylenol (Regular or Extra  Sudafed/Sudafed-12 Hour     Strength)  **Sudafed PE Pseudoephedrine   Tylenol Cold & Sinus     Vicks Vapor Rub  Zyrtec  **AVOID if Problems With Blood Pressure         Heartburn: Avoid lying down for at least 1 hour after meals  Aciphex      Maalox     Rash:  Milk of Magnesia     Benadryl    Mylanta       1% Hydrocortisone Cream  Pepcid  Pepcid Complete   Sleep Aids:  Prevacid      Ambien   Prilosec       Benadryl  Rolaids       Chamomile Tea  Tums (Limit 4/day)     Unisom  Zantac       Tylenol PM         Warm milk-add vanilla or  Hemorrhoids:       Sugar for taste  Anusol/Anusol H.C.  (RX: Analapram 2.5%)  Sugar Substitutes:  Hydrocortisone OTC     Ok in moderation  Preparation H      Tucks        Vaseline lotion applied to tissue with wiping    Herpes:     Throat:  Acyclovir      Oragel  Famvir  Valtrex     Vaccines:         Flu Shot Leg Cramps:       *Gardasil  Benadryl      Hepatitis A  Hepatitis B Nasal Spray:       Pneumovax  Saline Nasal Spray     Polio Booster         Tetanus Nausea:       Tuberculosis test or PPD  Vitamin B6 25 mg TID   AVOID:    Dramamine      *Gardasil  Emetrol       Live Poliovirus  Ginger Root 250 mg QID    MMR (measles, mumps &  High Complex Carbs @ Bedtime    rebella)  Sea Bands-Accupressure    Varicella (Chickenpox)  Unisom 1/2 tab TID     *No known complications           If received before Pain:         Known pregnancy;   Darvocet       Resume series after  Lortab        Delivery  Percocet    Yeast:   Tramadol      Femstat  Tylenol 3      Gyne-lotrimin  Ultram       Monistat  Vicodin           MISC:         All  Sunscreens           Hair Coloring/highlights          Insect Repellant's          (Including DEET)         [place] [employer] Eating Plan for Pregnant Women While you are pregnant, your body requires additional nutrition to help support your growing baby. You also have a higher need for some vitamins and minerals, such as folic acid, calcium, iron, and vitamin D. Eating a healthy, well-balanced diet is very important for your health and your baby's health. Your need for extra calories varies for the three 66-monthsegments of your pregnancy (trimesters). For most women, it is recommended to consume:  150 extra calories a day during the first trimester.  300 extra calories a day during the second trimester.  300 extra calories a day during the third trimester. What are tips for following this plan?   Do not try to lose weight or go on a diet during pregnancy.  Limit your overall intake of foods that have "empty calories." These are foods that have little nutritional value, such as sweets, desserts, candies, and sugar-sweetened beverages.  Eat a variety of foods (especially fruits and vegetables) to get a full range of vitamins and minerals.  Take a prenatal vitamin to help meet your additional vitamin and mineral needs during pregnancy, specifically for folic acid, iron, calcium, and vitamin D.  Remember to stay active. Ask your health care provider what types of exercise and activities are safe for you.  Practice good food safety and cleanliness. Wash your hands before you eat and after you prepare raw meat. Wash all fruits and vegetables well before peeling or eating. Taking these actions can help to prevent food-borne illnesses that can be very dangerous to your baby, such as listeriosis. Ask your health care provider for more information about listeriosis. What does 150 extra calories look like? Healthy options that provide 150 extra calories each day could be any of the following:  6-8 oz  (170-230 g) of plain low-fat yogurt with  cup of berries.  1 apple with 2 teaspoons (11 g) of peanut butter.  Cut-up vegetables with  cup (60 g) of hummus.  8  oz (230 mL) or 1 cup of low-fat chocolate milk.  1 stick of string cheese with 1 medium orange.  1 peanut butter and jelly sandwich that is made with one slice of whole-wheat bread and 1 tsp (5 g) of peanut butter. For 300 extra calories, you could eat two of those healthy options each day. What is a healthy amount of weight to gain? The right amount of weight gain for you is based on your BMI before you became pregnant. If your BMI:  Was less than 18 (underweight), you should gain 28-40 lb (13-18 kg).  Was 18-24.9 (normal), you should gain 25-35 lb (11-16 kg).  Was 25-29.9 (overweight), you should gain 15-25 lb (7-11 kg).  Was 30 or greater (obese), you should gain 11-20 lb (5-9 kg). What if I am having twins or multiples? Generally, if you are carrying twins or multiples:  You may need to eat 300-600 extra calories a day.  The recommended range for total weight gain is 25-54 lb (11-25 kg), depending on your BMI before pregnancy.  Talk with your health care provider to find out about nutritional needs, weight gain, and exercise that is right for you. What foods can I eat?  Grains All grains. Choose whole grains, such as whole-wheat bread, oatmeal, or brown rice. Vegetables All vegetables. Eat a variety of colors and types of vegetables. Remember to wash your vegetables well before peeling or eating. Fruits All fruits. Eat a variety of colors and types of fruit. Remember to wash your fruits well before peeling or eating. Meats and other protein foods Lean meats, including chicken, [place], fish, and lean cuts of beef, veal, or pork. If you eat fish or seafood, choose options that are higher in omega-3 fatty acids and lower in mercury, such as salmon, herring, mussels, trout, sardines, pollock, shrimp, crab, and lobster.  Tofu. Tempeh. Beans. Eggs. Peanut butter and other nut butters. Make sure that all meats, poultry, and eggs are cooked to food-safe temperatures or "well-done." Two or more servings of fish are recommended each week in order to get the most benefits from omega-3 fatty acids that are found in seafood. Choose fish that are lower in mercury. You can find more information online:  [employer] Dairy Pasteurized milk and milk alternatives (such as almond milk). Pasteurized yogurt and pasteurized cheese. Cottage cheese. Sour cream. Beverages Water. Juices that contain 100% fruit juice or vegetable juice. Caffeine-free teas and decaffeinated coffee. Drinks that contain caffeine are okay to drink, but it is better to avoid caffeine. Keep your total caffeine intake to less than 200 mg each day (which is 12 oz or 355 mL of coffee, tea, or soda) or the limit as told by your health care provider. Fats and oils Fats and oils are okay to include in moderation. Sweets and desserts Sweets and desserts are okay to include in moderation. Seasoning and other foods All pasteurized condiments. The items listed above may not be a complete list of recommended foods and beverages. Contact your dietitian for more options. What foods are not recommended? Vegetables Raw (unpasteurized) vegetable juices. Fruits Unpasteurized fruit juices. Meats and other protein foods Lunch meats, bologna, hot dogs, or other deli meats. (If you must eat those meats, reheat them until they are steaming hot.) Refrigerated pat, meat spreads from a meat counter, smoked seafood that is found in the refrigerated section of a store. Raw or undercooked meats, poultry, and eggs. Raw fish, such as sushi or sashimi. Fish that have high mercury  content, such as tilefish, shark, swordfish, and king mackerel. To learn more about mercury in fish, talk with your health care provider or look for online resources, such as:  [place]ch Dairy Raw  (unpasteurized) milk and any foods that have raw milk in them. Soft cheeses, such as feta, queso blanco, queso fresco, Brie, Camembert cheeses, blue-veined cheeses, and Panela cheese (unless it is made with pasteurized milk, which must be stated on the label). Beverages Alcohol. Sugar-sweetened beverages, such as sodas, teas, or energy drinks. Seasoning and other foods Homemade fermented foods and drinks, such as pickles, sauerkraut, or kombucha drinks. (Store-bought pasteurized versions of these are okay.) Salads that are made in a store or deli, such as ham salad, chicken salad, egg salad, tuna salad, and seafood salad. The items listed above may not be a complete list of foods and beverages to avoid. Contact your dietitian for more information. Where to find more information To calculate the number of calories you need based on your height, weight, and activity level, you can use an online calculator such as:  MobileTransition.ch To calculate how much weight you should gain during pregnancy, you can use an online pregnancy weight gain calculator such as:  StreamingFood.com.cy Summary  While you are pregnant, your body requires additional nutrition to help support your growing baby.  Eat a variety of foods, especially fruits and vegetables to get a full range of vitamins and minerals.  Practice good food safety and cleanliness. Wash your hands before you eat and after you prepare raw meat. Wash all fruits and vegetables well before peeling or eating. Taking these actions can help to prevent food-borne illnesses, such as listeriosis, that can be very dangerous to your baby.  Do not eat raw meat or fish. Do not eat fish that have high mercury content, such as tilefish, shark, swordfish, and king mackerel. Do not eat unpasteurized (raw) dairy.  Take a prenatal vitamin to help meet your additional vitamin and mineral needs during pregnancy,  specifically for folic acid, iron, calcium, and vitamin D. This information is not intended to replace advice given to you by your health care provider. Make sure you discuss any questions you have with your health care provider. Document Released: 06/18/2014 Document Revised: 05/31/2017 Document Reviewed: 05/31/2017 Elsevier Interactive Patient Education  2019 [place] of Pregnancy  The second trimester is from week 14 through week 27 (month 4 through 6). This is often the time in pregnancy that you feel your best. Often times, morning sickness has lessened or quit. You may have more energy, and you may get hungry more often. Your unborn baby is growing rapidly. At the end of the sixth month, he or she is about 9 inches long and weighs about 1 pounds. You will likely feel the baby move between 18 and 20 weeks of pregnancy. Follow these instructions at home: Medicines  Take over-the-counter and prescription medicines only as told by your doctor. Some medicines are safe and some medicines are not safe during pregnancy.  Take a prenatal vitamin that contains at least 600 micrograms (mcg) of folic acid.  If you have trouble pooping (constipation), take medicine that will make your stool soft (stool softener) if your doctor approves. Eating and drinking   Eat regular, healthy meals.  Avoid raw meat and uncooked cheese.  If you get low calcium from the food you eat, talk to your doctor about taking a daily calcium supplement.  Avoid foods that are high in fat and sugars,  such as fried and sweet foods.  If you feel sick to your stomach (nauseous) or throw up (vomit): ? Eat 4 or 5 small meals a day instead of 3 large meals. ? Try eating a few soda crackers. ? Drink liquids between meals instead of during meals.  To prevent constipation: ? Eat foods that are high in fiber, like fresh fruits and vegetables, whole grains, and beans. ? Drink enough fluids to keep your  pee (urine) clear or pale yellow. Activity  Exercise only as told by your doctor. Stop exercising if you start to have cramps.  Do not exercise if it is too hot, too humid, or if you are in a place of great height (high altitude).  Avoid heavy lifting.  Wear low-heeled shoes. Sit and stand up straight.  You can continue to have sex unless your doctor tells you not to. Relieving pain and discomfort  Wear a good support bra if your breasts are tender.  Take warm water baths (sitz baths) to soothe pain or discomfort caused by hemorrhoids. Use hemorrhoid cream if your doctor approves.  Rest with your legs raised if you have leg cramps or low back pain.  If you develop puffy, bulging veins (varicose veins) in your legs: ? Wear support hose or compression stockings as told by your doctor. ? Raise (elevate) your feet for 15 minutes, 3-4 times a day. ? Limit salt in your food. Prenatal care  Write down your questions. Take them to your prenatal visits.  Keep all your prenatal visits as told by your doctor. This is important. Safety  Wear your seat belt when driving.  Make a list of emergency phone numbers, including numbers for family, friends, the hospital, and police and fire departments. General instructions  Ask your doctor about the right foods to eat or for help finding a counselor, if you need these services.  Ask your doctor about local prenatal classes. Begin classes before month 6 of your pregnancy.  Do not use hot tubs, steam rooms, or saunas.  Do not douche or use tampons or scented sanitary pads.  Do not cross your legs for long periods of time.  Visit your dentist if you have not done so. Use a soft toothbrush to brush your teeth. Floss gently.  Avoid all smoking, herbs, and alcohol. Avoid drugs that are not approved by your doctor.  Do not use any products that contain nicotine or tobacco, such as cigarettes and e-cigarettes. If you need help quitting, ask  your doctor.  Avoid cat litter boxes and soil used by cats. These carry germs that can cause birth defects in the baby and can cause a loss of your baby (miscarriage) or stillbirth. Contact a doctor if:  You have mild cramps or pressure in your lower belly.  You have pain when you pee (urinate).  You have bad smelling fluid coming from your vagina.  You continue to feel sick to your stomach (nauseous), throw up (vomit), or have watery poop (diarrhea).  You have a nagging pain in your belly area.  You feel dizzy. Get help right away if:  You have a fever.  You are leaking fluid from your vagina.  You have spotting or bleeding from your vagina.  You have severe belly cramping or pain.  You lose or gain weight rapidly.  You have trouble catching your breath and have chest pain.  You notice sudden or extreme puffiness (swelling) of your face, hands, ankles, feet, or legs.  You  have not felt the baby move in over an hour.  You have severe headaches that do not go away when you take medicine.  You have trouble seeing. Summary  The second trimester is from week 14 through week 27 (months 4 through 6). This is often the time in pregnancy that you feel your best.  To take care of yourself and your unborn baby, you will need to eat healthy meals, take medicines only if your doctor tells you to do so, and do activities that are safe for you and your baby.  Call your doctor if you get sick or if you notice anything unusual about your pregnancy. Also, call your doctor if you need help with the right food to eat, or if you want to know what activities are safe for you. This information is not intended to replace advice given to you by your health care provider. Make sure you discuss any questions you have with your health care provider. Document Released: 11/28/2009 Document Revised: 10/09/2016 Document Reviewed: 10/09/2016 Elsevier Interactive Patient Education  2019 [employer].

## 2019-02-21 LAB — URINALYSIS, ROUTINE W REFLEX MICROSCOPIC
Bilirubin, UA: NEGATIVE
Glucose, UA: NEGATIVE
Ketones, UA: NEGATIVE
Leukocytes,UA: NEGATIVE
Nitrite, UA: NEGATIVE
Protein,UA: NEGATIVE
RBC, UA: NEGATIVE
Specific Gravity, UA: 1.024 (ref 1.005–1.030)
Urobilinogen, Ur: 0.2 mg/dL (ref 0.2–1.0)
pH, UA: 6 (ref 5.0–7.5)

## 2019-02-22 LAB — URINE CULTURE, OB REFLEX

## 2019-02-22 LAB — CULTURE, OB URINE

## 2019-02-23 LAB — MONITOR DRUG PROFILE 14(MW)
Amphetamine Scrn, Ur: NEGATIVE ng/mL
BARBITURATE SCREEN URINE: NEGATIVE ng/mL
BENZODIAZEPINE SCREEN, URINE: NEGATIVE ng/mL
Buprenorphine, Urine: NEGATIVE ng/mL
CANNABINOIDS UR QL SCN: NEGATIVE ng/mL
Cocaine (Metab) Scrn, Ur: NEGATIVE ng/mL
Creatinine(Crt), U: 135.5 mg/dL (ref 20.0–300.0)
Fentanyl, Urine: NEGATIVE pg/mL
Meperidine Screen, Urine: NEGATIVE ng/mL
Methadone Screen, Urine: NEGATIVE ng/mL
OXYCODONE+OXYMORPHONE UR QL SCN: NEGATIVE ng/mL
Opiate Scrn, Ur: NEGATIVE ng/mL
Ph of Urine: 6 (ref 4.5–8.9)
Phencyclidine Qn, Ur: NEGATIVE ng/mL
Propoxyphene Scrn, Ur: NEGATIVE ng/mL
SPECIFIC GRAVITY: 1.021
Tramadol Screen, Urine: NEGATIVE ng/mL

## 2019-02-23 LAB — NICOTINE SCREEN, URINE: Cotinine Ql Scrn, Ur: NEGATIVE ng/mL

## 2019-02-24 ENCOUNTER — Encounter: Payer: Self-pay | Admitting: Certified Nurse Midwife

## 2019-02-24 DIAGNOSIS — O26899 Other specified pregnancy related conditions, unspecified trimester: Secondary | ICD-10-CM | POA: Insufficient documentation

## 2019-02-24 DIAGNOSIS — Z6791 Unspecified blood type, Rh negative: Secondary | ICD-10-CM | POA: Insufficient documentation

## 2019-02-24 LAB — CBC WITH DIFFERENTIAL/PLATELET
Basophils Absolute: 0 10*3/uL (ref 0.0–0.2)
Basos: 1 %
EOS (ABSOLUTE): 0 10*3/uL (ref 0.0–0.4)
Eos: 0 %
Hematocrit: 36.9 % (ref 34.0–46.6)
Hemoglobin: 12.7 g/dL (ref 11.1–15.9)
Immature Grans (Abs): 0 10*3/uL (ref 0.0–0.1)
Immature Granulocytes: 0 %
Lymphocytes Absolute: 1.4 10*3/uL (ref 0.7–3.1)
Lymphs: 21 %
MCH: 30.9 pg (ref 26.6–33.0)
MCHC: 34.4 g/dL (ref 31.5–35.7)
MCV: 90 fL (ref 79–97)
Monocytes Absolute: 0.3 10*3/uL (ref 0.1–0.9)
Monocytes: 5 %
Neutrophils Absolute: 4.7 10*3/uL (ref 1.4–7.0)
Neutrophils: 73 %
Platelets: 194 10*3/uL (ref 150–450)
RBC: 4.11 x10E6/uL (ref 3.77–5.28)
RDW: 12.4 % (ref 11.7–15.4)
WBC: 6.4 10*3/uL (ref 3.4–10.8)

## 2019-02-24 LAB — RUBELLA SCREEN: Rubella Antibodies, IGG: 13.9 index (ref >0.99)

## 2019-02-24 LAB — CYTOLOGY - PAP: Diagnosis: NEGATIVE

## 2019-02-24 LAB — VARICELLA ZOSTER ANTIBODY, IGG: Varicella zoster IgG: 170 index (ref >165)

## 2019-02-24 LAB — ABO AND RH: Rh Factor: NEGATIVE

## 2019-02-24 LAB — HIV ANTIBODY (ROUTINE TESTING W REFLEX): HIV Screen 4th Generation wRfx: NONREACTIVE

## 2019-02-24 LAB — ANTIBODY SCREEN: Antibody Screen: NEGATIVE

## 2019-02-24 LAB — SICKLE CELL SCREEN: Sickle Cell Screen: NEGATIVE

## 2019-02-24 LAB — HEPATITIS B SURFACE ANTIGEN: Hepatitis B Surface Ag: NEGATIVE

## 2019-02-24 LAB — RPR: RPR Ser Ql: NONREACTIVE

## 2019-02-28 LAB — GC/CHLAMYDIA PROBE AMP
Chlamydia trachomatis, NAA: NEGATIVE
Neisseria Gonorrhoeae by PCR: NEGATIVE

## 2019-03-10 ENCOUNTER — Telehealth: Payer: Self-pay | Admitting: Certified Nurse Midwife

## 2019-03-10 ENCOUNTER — Telehealth: Payer: Self-pay

## 2019-03-10 NOTE — Telephone Encounter (Signed)
Pt is requesting gender results to be given to her aunt.   Pls advise.

## 2019-03-10 NOTE — Telephone Encounter (Signed)
Pt called to return missed call, Nurse picked up line. Thank you.

## 2019-03-10 NOTE — Telephone Encounter (Signed)
[place].  Unsure if patient wants aunt to pick up gender results from office or if we need to call her aunt and give gender over the phone.

## 2019-03-10 NOTE — Telephone Encounter (Signed)
Spoke with patient.  Patient request I call Aunt Danae Chen) at [phone] to give gender result.  Spoke with Danae Chen, she is aware of gender.

## 2019-03-18 ENCOUNTER — Encounter: Payer: Medicaid Other | Admitting: Certified Nurse Midwife

## 2019-03-23 ENCOUNTER — Ambulatory Visit (INDEPENDENT_AMBULATORY_CARE_PROVIDER_SITE_OTHER): Payer: Medicaid Other | Admitting: Certified Nurse Midwife

## 2019-03-23 ENCOUNTER — Other Ambulatory Visit: Payer: Self-pay

## 2019-03-23 VITALS — BP 105/65 | HR 76 | Wt 149.4 lb

## 2019-03-23 DIAGNOSIS — Z3402 Encounter for supervision of normal first pregnancy, second trimester: Secondary | ICD-10-CM

## 2019-03-23 NOTE — Progress Notes (Signed)
ROB doing well. Reviewed anatomy u/s at next visit. She verbalizes and agrees to plan. Follow up 2 wks with Melody .    Philip Aspen, CNM

## 2019-03-23 NOTE — Patient Instructions (Signed)

## 2019-04-13 ENCOUNTER — Encounter: Payer: Medicaid Other | Admitting: Certified Nurse Midwife

## 2019-04-21 ENCOUNTER — Other Ambulatory Visit: Payer: Self-pay

## 2019-04-21 ENCOUNTER — Ambulatory Visit (INDEPENDENT_AMBULATORY_CARE_PROVIDER_SITE_OTHER): Payer: Medicaid Other | Admitting: Obstetrics and Gynecology

## 2019-04-21 ENCOUNTER — Ambulatory Visit (INDEPENDENT_AMBULATORY_CARE_PROVIDER_SITE_OTHER): Payer: Medicaid Other

## 2019-04-21 VITALS — BP 121/63 | HR 88 | Wt 158.3 lb

## 2019-04-21 DIAGNOSIS — Z3492 Encounter for supervision of normal pregnancy, unspecified, second trimester: Secondary | ICD-10-CM

## 2019-04-21 DIAGNOSIS — Z3402 Encounter for supervision of normal first pregnancy, second trimester: Secondary | ICD-10-CM | POA: Diagnosis not present

## 2019-04-21 DIAGNOSIS — Z3A22 22 weeks gestation of pregnancy: Secondary | ICD-10-CM

## 2019-04-21 LAB — POCT URINALYSIS DIPSTICK OB
Bilirubin, UA: NEGATIVE
Blood, UA: NEGATIVE
Glucose, UA: NEGATIVE
Ketones, UA: NEGATIVE
Leukocytes, UA: NEGATIVE
Nitrite, UA: 0.2
POC,PROTEIN,UA: NEGATIVE
Spec Grav, UA: 1.015 (ref 1.010–1.025)
Urobilinogen, UA: NEGATIVE E.U./dL — AB
pH, UA: 6 (ref 5.0–8.0)

## 2019-04-21 NOTE — Progress Notes (Signed)
ROB- anatomy scan done today, pt is doing well 

## 2019-04-21 NOTE — Progress Notes (Signed)
Anatomy scan & ROB- doing well. Discussed weight gain, classes, circumcision, and rest of prenatal care.  Indications:Anatomy Ultrasound Findings:  Singleton intrauterine pregnancy is visualized with FHR at 156 BPM. Biometrics give an (U/S) Gestational age of [redacted]w[redacted]d and an (U/S) EDD of 08/24/2019; this correlates with the clinically established Estimated Date of Delivery: 08/23/19  Fetal presentation is Variable.  EFW: 475 g ( 1 lb 1 oz). Placenta: posterior. Grade: 1 AFI: subjectively normal.  Anatomic survey is complete and normal; Gender - female.    Right Ovary is normal in appearance. Left Ovary is normal appearance. Survey of the adnexa demonstrates no adnexal masses. There is no free peritoneal fluid in the cul de sac.  Impression: 1. [redacted]w[redacted]d Viable Singleton Intrauterine pregnancy by U/S. 2. (U/S) EDD is consistent with Clinically established Estimated Date of Delivery: 08/23/19 . 3. Normal Anatomy Scan

## 2019-05-19 ENCOUNTER — Other Ambulatory Visit: Payer: Self-pay

## 2019-05-19 ENCOUNTER — Ambulatory Visit (INDEPENDENT_AMBULATORY_CARE_PROVIDER_SITE_OTHER): Payer: Medicaid Other | Admitting: Certified Nurse Midwife

## 2019-05-19 VITALS — BP 120/70 | HR 78 | Wt 165.8 lb

## 2019-05-19 DIAGNOSIS — Z3492 Encounter for supervision of normal pregnancy, unspecified, second trimester: Secondary | ICD-10-CM

## 2019-05-19 DIAGNOSIS — Z131 Encounter for screening for diabetes mellitus: Secondary | ICD-10-CM

## 2019-05-19 DIAGNOSIS — M545 Low back pain, unspecified: Secondary | ICD-10-CM

## 2019-05-19 DIAGNOSIS — Z113 Encounter for screening for infections with a predominantly sexual mode of transmission: Secondary | ICD-10-CM

## 2019-05-19 DIAGNOSIS — O9989 Other specified diseases and conditions complicating pregnancy, childbirth and the puerperium: Secondary | ICD-10-CM

## 2019-05-19 DIAGNOSIS — Z3A26 26 weeks gestation of pregnancy: Secondary | ICD-10-CM

## 2019-05-19 DIAGNOSIS — Z130 Encounter for screening for diseases of the blood and blood-forming organs and certain disorders involving the immune mechanism: Secondary | ICD-10-CM

## 2019-05-19 LAB — POCT URINALYSIS DIPSTICK OB
Bilirubin, UA: NEGATIVE
Blood, UA: NEGATIVE
Glucose, UA: NEGATIVE
Ketones, UA: NEGATIVE
Leukocytes, UA: NEGATIVE
Nitrite, UA: NEGATIVE
Spec Grav, UA: 1.02 (ref 1.010–1.025)
Urobilinogen, UA: 0.2 E.U./dL
pH, UA: 6.5 (ref 5.0–8.0)

## 2019-05-19 NOTE — Patient Instructions (Addendum)
   Sciatica  Sciatica is pain, weakness, tingling, or loss of feeling (numbness) along the sciatic nerve. The sciatic nerve starts in the lower back and goes down the back of each leg. Sciatica usually goes away on its own or with treatment. Sometimes, sciatica may come back (recur). What are the causes? This condition happens when the sciatic nerve is pinched or has pressure put on it. This may be the result of:  A disk in between the bones of the spine bulging out too far (herniated disk).  Changes in the spinal disks that occur with aging.  A condition that affects a muscle in the butt.  Extra bone growth near the sciatic nerve.  A break (fracture) of the area between your hip bones (pelvis).  Pregnancy.  Tumor. This is rare. What increases the risk? You are more likely to develop this condition if you:  Play sports that put pressure or stress on the spine.  Have poor strength and ease of movement (flexibility).  Have had a back injury in the past.  Have had back surgery.  Sit for long periods of time.  Do activities that involve bending or lifting over and over again.  Are very overweight (obese). What are the signs or symptoms? Symptoms can vary from mild to very bad. They may include:  Any of these problems in the lower back, leg, hip, or butt: ? Mild tingling, loss of feeling, or dull aches. ? Burning sensations. ? Sharp pains.  Loss of feeling in the back of the calf or the sole of the foot.  Leg weakness.  Very bad back pain that makes it hard to move. These symptoms may get worse when you cough, sneeze, or laugh. They may also get worse when you sit or stand for long periods of time. How is this treated? This condition often gets better without any treatment. However, treatment may include:  Changing or cutting back on physical activity when you have pain.  Doing exercises and stretching.  Putting ice or heat on the affected area.  Medicines  that help: ? To relieve pain and swelling. ? To relax your muscles.  Shots (injections) of medicines that help to relieve pain, irritation, and swelling.  Surgery. Follow these instructions at home: Medicines  Take over-the-counter and prescription medicines only as told by your doctor.  Ask your doctor if the medicine prescribed to you: ? Requires you to avoid driving or using heavy machinery. ? Can cause trouble pooping (constipation). You may need to take these steps to prevent or treat trouble pooping:  Drink enough fluids to keep your pee (urine) pale yellow.  Take over-the-counter or prescription medicines.  Eat foods that are high in fiber. These include beans, whole grains, and fresh fruits and vegetables.  Limit foods that are high in fat and sugar. These include fried or sweet foods. Managing pain      If told, put ice on the affected area. ? Put ice in a plastic bag. ? Place a towel between your skin and the bag. ? Leave the ice on for 20 minutes, 2-3 times a day.  If told, put heat on the affected area. Use the heat source that your doctor tells you to use, such as a moist heat pack or a heating pad. ? Place a towel between your skin and the heat source. ? Leave the heat on for 20-30 minutes. ? Remove the heat if your skin turns bright red. This is very important   unable to feel pain, heat, or cold. You may have a greater risk of getting burned. Activity   Return to your normal activities as told by your doctor. Ask your doctor what activities are safe for you.  Avoid activities that make your symptoms worse.  Take short rests during the day. ? When you rest for a long time, do some physical activity or stretching between periods of rest. ? Avoid sitting for a long time without moving. Get up and move around at least one time each hour.  Exercise and stretch regularly, as told by your doctor.  Do not lift anything that is heavier than 10 lb (4.5 kg) while  you have symptoms of sciatica. ? Avoid lifting heavy things even when you do not have symptoms. ? Avoid lifting heavy things over and over.  When you lift objects, always lift in a way that is safe for your body. To do this, you should: ? Bend your knees. ? Keep the object close to your body. ? Avoid twisting. General instructions  Stay at a healthy weight.  Wear comfortable shoes that support your feet. Avoid wearing high heels.  Avoid sleeping on a mattress that is too soft or too hard. You might have less pain if you sleep on a mattress that is firm enough to support your back.  Keep all follow-up visits as told by your doctor. This is important. Contact a doctor if:  You have pain that: ? Wakes you up when you are sleeping. ? Gets worse when you lie down. ? Is worse than the pain you have had in the past. ? Lasts longer than 4 weeks.  You lose weight without trying. Get help right away if:  You cannot control when you pee (urinate) or poop (have a bowel movement).  You have weakness in any of these areas and it gets worse: ? Lower back. ? The area between your hip bones. ? Butt. ? Legs.  You have redness or swelling of your back.  You have a burning feeling when you pee. Summary  Sciatica is pain, weakness, tingling, or loss of feeling (numbness) along the sciatic nerve.  This condition happens when the sciatic nerve is pinched or has pressure put on it.  Sciatica can cause pain, tingling, or loss of feeling (numbness) in the lower back, legs, hips, and butt.  Treatment often includes rest, exercise, medicines, and putting ice or heat on the affected area. This information is not intended to replace advice given to you by your health care provider. Make sure you discuss any questions you have with your health care provider. Document Released: 06/12/2008 Document Revised: 09/22/2018 Document Reviewed: 09/22/2018 Elsevier Patient Education  2020 [employer]   WHAT OB PATIENTS CAN EXPECT  Confirmation of pregnancy and ultrasound ordered if medically indicated-[redacted] weeks gestation New OB (NOB) intake with nurse and New OB (NOB) labs- [redacted] weeks gestation New OB (NOB) physical examination with provider- 11/[redacted] weeks gestation Flu vaccine-[redacted] weeks gestation Anatomy scan-[redacted] weeks gestation Glucose tolerance test, blood work to test for anemia, T-dap vaccine-[redacted] weeks gestation Vaginal swabs/cultures-STD/Group B strep-[redacted] weeks gestation Appointments every 4 weeks until 28 weeks Every 2 weeks from 28 weeks until 36 weeks Weekly visits from 36 weeks until delivery   Breastfeeding  Choosing to breastfeed is one of the best decisions you can make for yourself and your baby. A change in hormones during pregnancy causes your breasts to make breast milk in your milk-producing glands. Hormones prevent breast  milk from being released before your baby is born. They also prompt milk flow after birth. Once breastfeeding has begun, thoughts of your baby, as well as his or her sucking or crying, can stimulate the release of milk from your milk-producing glands. Benefits of breastfeeding Research shows that breastfeeding offers many health benefits for infants and mothers. It also offers a cost-free and convenient way to feed your baby. For your baby  Your first milk (colostrum) helps your baby's digestive system to function better.  Special cells in your milk (antibodies) help your baby to fight off infections.  Breastfed babies are less likely to develop asthma, allergies, obesity, or type 2 diabetes. They are also at lower risk for sudden infant death syndrome (SIDS).  Nutrients in breast milk are better able to meet your baby's needs compared to infant formula.  Breast milk improves your baby's brain development. For you  Breastfeeding helps to create a very special bond between you and your baby.  Breastfeeding is convenient. Breast milk costs nothing and is  always available at the correct temperature.  Breastfeeding helps to burn calories. It helps you to lose the weight that you gained during pregnancy.  Breastfeeding makes your uterus return faster to its size before pregnancy. It also slows bleeding (lochia) after you give birth.  Breastfeeding helps to lower your risk of developing type 2 diabetes, osteoporosis, rheumatoid arthritis, cardiovascular disease, and breast, ovarian, uterine, and endometrial cancer later in life. Breastfeeding basics Starting breastfeeding  Find a comfortable place to sit or lie down, with your neck and back well-supported.  Place a pillow or a rolled-up blanket under your baby to bring him or her to the level of your breast (if you are seated). Nursing pillows are specially designed to help support your arms and your baby while you breastfeed.  Make sure that your baby's tummy (abdomen) is facing your abdomen.  Gently massage your breast. With your fingertips, massage from the outer edges of your breast inward toward the nipple. This encourages milk flow. If your milk flows slowly, you may need to continue this action during the feeding.  Support your breast with 4 fingers underneath and your thumb above your nipple (make the letter "C" with your hand). Make sure your fingers are well away from your nipple and your baby's mouth.  Stroke your baby's lips gently with your finger or nipple.  When your baby's mouth is open wide enough, quickly bring your baby to your breast, placing your entire nipple and as much of the areola as possible into your baby's mouth. The areola is the colored area around your nipple. ? More areola should be visible above your baby's upper lip than below the lower lip. ? Your baby's lips should be opened and extended outward (flanged) to ensure an adequate, comfortable latch. ? Your baby's tongue should be between his or her lower gum and your breast.  Make sure that your baby's mouth  is correctly positioned around your nipple (latched). Your baby's lips should create a seal on your breast and be turned out (everted).  It is common for your baby to suck about 2-3 minutes in order to start the flow of breast milk. Latching Teaching your baby how to latch onto your breast properly is very important. An improper latch can cause nipple pain, decreased milk supply, and poor weight gain in your baby. Also, if your baby is not latched onto your nipple properly, he or she may swallow some air during  feeding. This can make your baby fussy. Burping your baby when you switch breasts during the feeding can help to get rid of the air. However, teaching your baby to latch on properly is still the best way to prevent fussiness from swallowing air while breastfeeding. Signs that your baby has successfully latched onto your nipple  Silent tugging or silent sucking, without causing you pain. Infant's lips should be extended outward (flanged).  Swallowing heard between every 3-4 sucks once your milk has started to flow (after your let-down milk reflex occurs).  Muscle movement above and in front of his or her ears while sucking. Signs that your baby has not successfully latched onto your nipple  Sucking sounds or smacking sounds from your baby while breastfeeding.  Nipple pain. If you think your baby has not latched on correctly, slip your finger into the corner of your baby's mouth to break the suction and place it between your baby's gums. Attempt to start breastfeeding again. Signs of successful breastfeeding Signs from your baby  Your baby will gradually decrease the number of sucks or will completely stop sucking.  Your baby will fall asleep.  Your baby's body will relax.  Your baby will retain a small amount of milk in his or her mouth.  Your baby will let go of your breast by himself or herself. Signs from you  Breasts that have increased in firmness, weight, and size 1-3  hours after feeding.  Breasts that are softer immediately after breastfeeding.  Increased milk volume, as well as a change in milk consistency and color by the fifth day of breastfeeding.  Nipples that are not sore, cracked, or bleeding. Signs that your baby is getting enough milk  Wetting at least 1-2 diapers during the first 24 hours after birth.  Wetting at least 5-6 diapers every 24 hours for the first week after birth. The urine should be clear or pale yellow by the age of 5 days.  Wetting 6-8 diapers every 24 hours as your baby continues to grow and develop.  At least 3 stools in a 24-hour period by the age of 5 days. The stool should be soft and yellow.  At least 3 stools in a 24-hour period by the age of 7 days. The stool should be seedy and yellow.  No loss of weight greater than 10% of birth weight during the first 3 days of life.  Average weight gain of 4-7 oz (113-198 g) per week after the age of 4 days.  Consistent daily weight gain by the age of 5 days, without weight loss after the age of 2 weeks. After a feeding, your baby may spit up a small amount of milk. This is normal. Breastfeeding frequency and duration Frequent feeding will help you make more milk and can prevent sore nipples and extremely full breasts (breast engorgement). Breastfeed when you feel the need to reduce the fullness of your breasts or when your baby shows signs of hunger. This is called "breastfeeding on demand." Signs that your baby is hungry include:  Increased alertness, activity, or restlessness.  Movement of the head from side to side.  Opening of the mouth when the corner of the mouth or cheek is stroked (rooting).  Increased sucking sounds, smacking lips, cooing, sighing, or squeaking.  Hand-to-mouth movements and sucking on fingers or hands.  Fussing or crying. Avoid introducing a pacifier to your baby in the first 4-6 weeks after your baby is born. After this time, you may choose  to use a pacifier. Research has shown that pacifier use during the first year of a baby's life decreases the risk of sudden infant death syndrome (SIDS). Allow your baby to feed on each breast as long as he or she wants. When your baby unlatches or falls asleep while feeding from the first breast, offer the second breast. Because newborns are often sleepy in the first few weeks of life, you may need to awaken your baby to get him or her to feed. Breastfeeding times will vary from baby to baby. However, the following rules can serve as a guide to help you make sure that your baby is properly fed:  Newborns (babies 63 weeks of age or younger) may breastfeed every 1-3 hours.  Newborns should not go without breastfeeding for longer than 3 hours during the day or 5 hours during the night.  You should breastfeed your baby a minimum of 8 times in a 24-hour period. Breast milk pumping     Pumping and storing breast milk allows you to make sure that your baby is exclusively fed your breast milk, even at times when you are unable to breastfeed. This is especially important if you go back to work while you are still breastfeeding, or if you are not able to be present during feedings. Your lactation consultant can help you find a method of pumping that works best for you and give you guidelines about how long it is safe to store breast milk. Caring for your breasts while you breastfeed Nipples can become dry, cracked, and sore while breastfeeding. The following recommendations can help keep your breasts moisturized and healthy:  Avoid using soap on your nipples.  Wear a supportive bra designed especially for nursing. Avoid wearing underwire-style bras or extremely tight bras (sports bras).  Air-dry your nipples for 3-4 minutes after each feeding.  Use only cotton bra pads to absorb leaked breast milk. Leaking of breast milk between feedings is normal.  Use lanolin on your nipples after breastfeeding.  Lanolin helps to maintain your skin's normal moisture barrier. Pure lanolin is not harmful (not toxic) to your baby. You may also hand express a few drops of breast milk and gently massage that milk into your nipples and allow the milk to air-dry. In the first few weeks after giving birth, some women experience breast engorgement. Engorgement can make your breasts feel heavy, warm, and tender to the touch. Engorgement peaks within 3-5 days after you give birth. The following recommendations can help to ease engorgement:  Completely empty your breasts while breastfeeding or pumping. You may want to start by applying warm, moist heat (in the shower or with warm, water-soaked hand towels) just before feeding or pumping. This increases circulation and helps the milk flow. If your baby does not completely empty your breasts while breastfeeding, pump any extra milk after he or she is finished.  Apply ice packs to your breasts immediately after breastfeeding or pumping, unless this is too uncomfortable for you. To do this: ? Put ice in a plastic bag. ? Place a towel between your skin and the bag. ? Leave the ice on for 20 minutes, 2-3 times a day.  Make sure that your baby is latched on and positioned properly while breastfeeding. If engorgement persists after 48 hours of following these recommendations, contact your health care provider or a Advertising copywriter. Overall health care recommendations while breastfeeding  Eat 3 healthy meals and 3 snacks every day. Well-nourished mothers who are breastfeeding need  an additional 450-500 calories a day. You can meet this requirement by increasing the amount of a balanced diet that you eat.  Drink enough water to keep your urine pale yellow or clear.  Rest often, relax, and continue to take your prenatal vitamins to prevent fatigue, stress, and low vitamin and mineral levels in your body (nutrient deficiencies).  Do not use any products that contain nicotine  or tobacco, such as cigarettes and e-cigarettes. Your baby may be harmed by chemicals from cigarettes that pass into breast milk and exposure to secondhand smoke. If you need help quitting, ask your health care provider.  Avoid alcohol.  Do not use illegal drugs or marijuana.  Talk with your health care provider before taking any medicines. These include over-the-counter and prescription medicines as well as vitamins and herbal supplements. Some medicines that may be harmful to your baby can pass through breast milk.  It is possible to become pregnant while breastfeeding. If birth control is desired, ask your health care provider about options that will be safe while breastfeeding your baby. Where to find more information: [employer]: www.llli.org Contact a health care provider if:  You feel like you want to stop breastfeeding or have become frustrated with breastfeeding.  Your nipples are cracked or bleeding.  Your breasts are red, tender, or warm.  You have: ? Painful breasts or nipples. ? A swollen area on either breast. ? A fever or chills. ? Nausea or vomiting. ? Drainage other than breast milk from your nipples.  Your breasts do not become full before feedings by the fifth day after you give birth.  You feel sad and depressed.  Your baby is: ? Too sleepy to eat well. ? Having trouble sleeping. ? More than 63 week old and wetting fewer than 6 diapers in a 24-hour period. ? Not gaining weight by 69 days of age.  Your baby has fewer than 3 stools in a 24-hour period.  Your baby's skin or the white parts of his or her eyes become yellow. Get help right away if:  Your baby is overly tired (lethargic) and does not want to wake up and feed.  Your baby develops an unexplained fever. Summary  Breastfeeding offers many health benefits for infant and mothers.  Try to breastfeed your infant when he or she shows early signs of hunger.  Gently tickle or  stroke your baby's lips with your finger or nipple to allow the baby to open his or her mouth. Bring the baby to your breast. Make sure that much of the areola is in your baby's mouth. Offer one side and burp the baby before you offer the other side.  Talk with your health care provider or lactation consultant if you have questions or you face problems as you breastfeed. This information is not intended to replace advice given to you by your health care provider. Make sure you discuss any questions you have with your health care provider. Document Released: 09/03/2005 Document Revised: 11/28/2017 Document Reviewed: 10/05/2016 Elsevier Patient Education  2020 [employer].  [facility]  [address] [place], [place], [place] [address]  Phone: [phone]   [facility] (second location)  [address] [place], [place] [address]  Phone: [phone]   [facility]) [address] [place], [place], [place] [address] Phone: [phone]   [facility]  [address], [place], [place] [address]  Phone: [phone]   Vaginal  Delivery  Vaginal delivery means that you give birth by pushing your baby out of your birth canal (vagina). A team of health care providers will help you before, during, and after vaginal delivery. Birth experiences are unique for every woman and every pregnancy, and birth experiences vary depending on where you choose to give birth. What happens when I arrive at the birth center or hospital? Once you are in labor and have been admitted into the hospital or birth center, your health care provider may:  Review your pregnancy history and any concerns that you have.  Insert an IV into one of your veins. This may be used to give you fluids and medicines.  Check your blood pressure, pulse, temperature, and heart rate (vital signs).  Check whether your bag of water (amniotic sac) has broken  (ruptured).  Talk with you about your birth plan and discuss pain control options. Monitoring Your health care provider may monitor your contractions (uterine monitoring) and your baby's heart rate (fetal monitoring). You may need to be monitored:  Often, but not continuously (intermittently).  All the time or for long periods at a time (continuously). Continuous monitoring may be needed if: ? You are taking certain medicines, such as medicine to relieve pain or make your contractions stronger. ? You have pregnancy or labor complications. Monitoring may be done by:  Placing a special stethoscope or a handheld monitoring device on your abdomen to check your baby's heartbeat and to check for contractions.  Placing monitors on your abdomen (external monitors) to record your baby's heartbeat and the frequency and length of contractions.  Placing monitors inside your uterus through your vagina (internal monitors) to record your baby's heartbeat and the frequency, length, and strength of your contractions. Depending on the type of monitor, it may remain in your uterus or on your baby's head until birth.  Telemetry. This is a type of continuous monitoring that can be done with external or internal monitors. Instead of having to stay in bed, you are able to move around during telemetry. Physical exam Your health care provider may perform frequent physical exams. This may include:  Checking how and where your baby is positioned in your uterus.  Checking your cervix to determine: ? Whether it is thinning out (effacing). ? Whether it is opening up (dilating). What happens during labor and delivery?  Normal labor and delivery is divided into the following three stages: Stage 1  This is the longest stage of labor.  This stage can last for hours or days.  Throughout this stage, you will feel contractions. Contractions generally feel mild, infrequent, and irregular at first. They get stronger,  more frequent (about every 2-3 minutes), and more regular as you move through this stage.  This stage ends when your cervix is completely dilated to 4 inches (10 cm) and completely effaced. Stage 2  This stage starts once your cervix is completely effaced and dilated and lasts until the delivery of your baby.  This stage may last from 20 minutes to 2 hours.  This is the stage where you will feel an urge to push your baby out of your vagina.  You may feel stretching and burning pain, especially when the widest part of your baby's head passes through the vaginal opening (crowning).  Once your baby is delivered, the umbilical cord will be clamped and cut. This usually occurs after waiting a period of 1-2 minutes after delivery.  Your baby will be placed on your bare chest (  skin-to-skin contact) in an upright position and covered with a warm blanket. Watch your baby for feeding cues, like rooting or sucking, and help the baby to your breast for his or her first feeding. Stage 3  This stage starts immediately after the birth of your baby and ends after you deliver the placenta.  This stage may take anywhere from 5 to 30 minutes.  After your baby has been delivered, you will feel contractions as your body expels the placenta and your uterus contracts to control bleeding. What can I expect after labor and delivery?  After labor is over, you and your baby will be monitored closely until you are ready to go home to ensure that you are both healthy. Your health care team will teach you how to care for yourself and your baby.  You and your baby will stay in the same room (rooming in) during your hospital stay. This will encourage early bonding and successful breastfeeding.  You may continue to receive fluids and medicines through an IV.  Your uterus will be checked and massaged regularly (fundal massage).  You will have some soreness and pain in your abdomen, vagina, and the area of skin  between your vaginal opening and your anus (perineum).  If an incision was made near your vagina (episiotomy) or if you had some vaginal tearing during delivery, cold compresses may be placed on your episiotomy or your tear. This helps to reduce pain and swelling.  You may be given a squirt bottle to use instead of wiping when you go to the bathroom. To use the squirt bottle, follow these steps: ? Before you urinate, fill the squirt bottle with warm water. Do not use hot water. ? After you urinate, while you are sitting on the toilet, use the squirt bottle to rinse the area around your urethra and vaginal opening. This rinses away any urine and blood. ? Fill the squirt bottle with clean water every time you use the bathroom.  It is normal to have vaginal bleeding after delivery. Wear a sanitary pad for vaginal bleeding and discharge. Summary  Vaginal delivery means that you will give birth by pushing your baby out of your birth canal (vagina).  Your health care provider may monitor your contractions (uterine monitoring) and your baby's heart rate (fetal monitoring).  Your health care provider may perform a physical exam.  Normal labor and delivery is divided into three stages.  After labor is over, you and your baby will be monitored closely until you are ready to go home. This information is not intended to replace advice given to you by your health care provider. Make sure you discuss any questions you have with your health care provider. Document Released: 06/12/2008 Document Revised: 10/08/2017 Document Reviewed: 10/08/2017 Elsevier Patient Education  2020 [employer]   Fetal Movement Counts Patient Name: ________________________________________________ Patient Due Date: ____________________ What is a fetal movement count?  A fetal movement count is the number of times that you feel your baby move during a certain amount of time. This may also be called a fetal kick count. A  fetal movement count is recommended for every pregnant woman. You may be asked to start counting fetal movements as early as week 28 of your pregnancy. Pay attention to when your baby is most active. You may notice your baby's sleep and wake cycles. You may also notice things that make your baby move more. You should do a fetal movement count:  When your baby is  normally most active.  At the same time each day. A good time to count movements is while you are resting, after having something to eat and drink. How do I count fetal movements? 1. Find a quiet, comfortable area. Sit, or lie down on your side. 2. Write down the date, the start time and stop time, and the number of movements that you felt between those two times. Take this information with you to your health care visits. 3. For 2 hours, count kicks, flutters, swishes, rolls, and jabs. You should feel at least 10 movements during 2 hours. 4. You may stop counting after you have felt 10 movements. 5. If you do not feel 10 movements in 2 hours, have something to eat and drink. Then, keep resting and counting for 1 hour. If you feel at least 4 movements during that hour, you may stop counting. Contact a health care provider if:  You feel fewer than 4 movements in 2 hours.  Your baby is not moving like he or she usually does. Date: ____________ Start time: ____________ Stop time: ____________ Movements: ____________ Date: ____________ Start time: ____________ Stop time: ____________ Movements: ____________ Date: ____________ Start time: ____________ Stop time: ____________ Movements: ____________ Date: ____________ Start time: ____________ Stop time: ____________ Movements: ____________ Date: ____________ Start time: ____________ Stop time: ____________ Movements: ____________ Date: ____________ Start time: ____________ Stop time: ____________ Movements: ____________ Date: ____________ Start time: ____________ Stop time: ____________  Movements: ____________ Date: ____________ Start time: ____________ Stop time: ____________ Movements: ____________ Date: ____________ Start time: ____________ Stop time: ____________ Movements: ____________ This information is not intended to replace advice given to you by your health care provider. Make sure you discuss any questions you have with your health care provider. Document Released: 10/03/2006 Document Revised: 09/23/2018 Document Reviewed: 10/13/2015 Elsevier Patient Education  2020 [employer].

## 2019-05-19 NOTE — Progress Notes (Signed)
ROB-Patient c/o intermittent right sided lower back and gluteal pain x1 month.

## 2019-05-19 NOTE — Progress Notes (Signed)
ROB-Reports intermittent right lower back and gluteal pain for the last month. Discussed home treatment measures including the use of abdominal support. Third trimester handouts provided. Anticipatory guidance regarding course of prenatal care. Reviewed red flag symptoms and when to call. RTC x 2 weeks for 28 wk labs, TDaP/Flu/Rhogam and ROB or sooner if needed.

## 2019-06-02 ENCOUNTER — Encounter: Payer: Medicaid Other | Admitting: Certified Nurse Midwife

## 2019-06-02 ENCOUNTER — Other Ambulatory Visit: Payer: Medicaid Other

## 2019-06-05 ENCOUNTER — Ambulatory Visit (INDEPENDENT_AMBULATORY_CARE_PROVIDER_SITE_OTHER): Payer: Medicaid Other | Admitting: Certified Nurse Midwife

## 2019-06-05 ENCOUNTER — Other Ambulatory Visit: Payer: Self-pay

## 2019-06-05 ENCOUNTER — Other Ambulatory Visit: Payer: Medicaid Other

## 2019-06-05 VITALS — BP 111/76 | HR 82 | Wt 168.3 lb

## 2019-06-05 DIAGNOSIS — Z130 Encounter for screening for diseases of the blood and blood-forming organs and certain disorders involving the immune mechanism: Secondary | ICD-10-CM

## 2019-06-05 DIAGNOSIS — Z23 Encounter for immunization: Secondary | ICD-10-CM

## 2019-06-05 DIAGNOSIS — Z3492 Encounter for supervision of normal pregnancy, unspecified, second trimester: Secondary | ICD-10-CM

## 2019-06-05 DIAGNOSIS — Z113 Encounter for screening for infections with a predominantly sexual mode of transmission: Secondary | ICD-10-CM

## 2019-06-05 DIAGNOSIS — Z131 Encounter for screening for diabetes mellitus: Secondary | ICD-10-CM

## 2019-06-05 LAB — POCT URINALYSIS DIPSTICK OB
Bilirubin, UA: NEGATIVE
Blood, UA: NEGATIVE
Glucose, UA: NEGATIVE
Ketones, UA: NEGATIVE
Leukocytes, UA: NEGATIVE
Nitrite, UA: NEGATIVE
POC,PROTEIN,UA: NEGATIVE
Spec Grav, UA: 1.01 (ref 1.010–1.025)
Urobilinogen, UA: 0.2 E.U./dL
pH, UA: 8 (ref 5.0–8.0)

## 2019-06-05 MED ORDER — TETANUS-DIPHTH-ACELL PERTUSSIS 5-2.5-18.5 LF-MCG/0.5 IM SUSP
0.5000 mL | Freq: Once | INTRAMUSCULAR | Status: AC
  Administered 2019-06-05: 0.5 mL via INTRAMUSCULAR

## 2019-06-05 MED ORDER — RHO D IMMUNE GLOBULIN 1500 UNITS IM SOSY
1500.0000 [IU] | PREFILLED_SYRINGE | Freq: Once | INTRAMUSCULAR | Status: AC
  Administered 2019-06-05: 1500 [IU] via INTRAMUSCULAR

## 2019-06-05 NOTE — Progress Notes (Signed)
ROB doing well. Feel good movement. 28 wk labs today. TDAp/BTC/Rhogam today as well. Discussed Birth plan, she is to bring in worksheet. Still interested in water birth if it becomes available. Information on [facility] after baby given. Request referral for chiropractor due to back/pelvic pain in pregnancy. Order placed. Follow up 2 wks.  Philip Aspen, CNM

## 2019-06-05 NOTE — Patient Instructions (Signed)
Td Vaccine (Tetanus and Diphtheria): What You Need to Know 1. Why get vaccinated? Tetanus  and diphtheria are very serious diseases. They are rare in the [place] today, but people who do become infected often have severe complications. Td vaccine is used to protect adolescents and adults from both of these diseases. Both tetanus and diphtheria are infections caused by bacteria. Diphtheria spreads from person to person through coughing or sneezing. Tetanus-causing bacteria enter the body through cuts, scratches, or wounds. TETANUS (Lockjaw) causes painful muscle tightening and stiffness, usually all over the body.  It can lead to tightening of muscles in the head and neck so you can't open your mouth, swallow, or sometimes even breathe. Tetanus kills about 1 out of every 10 people who are infected even after receiving the best medical care. DIPHTHERIA can cause a thick coating to form in the back of the throat.  It can lead to breathing problems, paralysis, heart failure, and death. Before vaccines, as many as 200,000 cases of diphtheria and hundreds of cases of tetanus were reported in the [place] each year. Since vaccination began, reports of cases for both diseases have dropped by about 99%. 2. Td vaccine Td vaccine can protect adolescents and adults from tetanus and diphtheria. Td is usually given as a booster dose every 10 years but it can also be given earlier after a severe and dirty wound or burn. Another vaccine, called Tdap, which protects against pertussis in addition to tetanus and diphtheria, is sometimes recommended instead of Td vaccine. Your doctor or the person giving you the vaccine can give you more information. Td may safely be given at the same time as other vaccines. 3. Some people should not get this vaccine  A person who has ever had a life-threatening allergic reaction after a previous dose of any tetanus or diphtheria containing vaccine, OR has a severe allergy  to any part of this vaccine, should not get Td vaccine. Tell the person giving the vaccine about any severe allergies.  Talk to your doctor if you: ? had severe pain or swelling after any vaccine containing diphtheria or tetanus, ? ever had a condition called Guillain Barr Syndrome (GBS), ? aren't feeling well on the day the shot is scheduled. 4. Risks of a vaccine reaction With any medicine, including vaccines, there is a chance of side effects. These are usually mild and go away on their own. Serious reactions are also possible but are rare. Most people who get Td vaccine do not have any problems with it. Mild Problems following Td vaccine: (Did not interfere with activities)  Pain where the shot was given (about 8 people in 10)  Redness or swelling where the shot was given (about 1 person in 4)  Mild fever (rare)  Headache (about 1 person in 4)  Tiredness (about 1 person in 4) Moderate Problems following Td vaccine: (Interfered with activities, but did not require medical attention)  Fever over 102F (rare) Severe Problems following Td vaccine: (Unable to perform usual activities; required medical attention)  Swelling, severe pain, bleeding and/or redness in the arm where the shot was given (rare). Problems that could happen after any vaccine:  People sometimes faint after a medical procedure, including vaccination. Sitting or lying down for about 15 minutes can help prevent fainting, and injuries caused by a fall. Tell your doctor if you feel dizzy, or have vision changes or ringing in the ears.  Some people get severe pain in the shoulder and have   difficulty moving the arm where a shot was given. This happens very rarely.  Any medication can cause a severe allergic reaction. Such reactions from a vaccine are very rare, estimated at fewer than 1 in a million doses, and would happen within a few minutes to a few hours after the vaccination. As with any medicine, there is a  very remote chance of a vaccine causing a serious injury or death. The safety of vaccines is always being monitored. For more information, visit: www.cdc.gov/vaccinesafety/ 5. What if there is a serious reaction? What should I look for?  Look for anything that concerns you, such as signs of a severe allergic reaction, very high fever, or unusual behavior. Signs of a severe allergic reaction can include hives, swelling of the face and throat, difficulty breathing, a fast heartbeat, dizziness, and weakness. These would usually start a few minutes to a few hours after the vaccination. What should I do?  If you think it is a severe allergic reaction or other emergency that can't wait, call 9-1-1 or get the person to the nearest hospital. Otherwise, call your doctor.  Afterward, the reaction should be reported to the Vaccine Adverse Event Reporting System (VAERS). Your doctor might file this report, or you can do it yourself through the VAERS web site at www.vaers.hhs.gov, or by calling 1-800-822-7967. VAERS does not give medical advice. 6. The National Vaccine Injury Compensation Program The National Vaccine Injury Compensation Program (VICP) is a federal program that was created to compensate people who may have been injured by certain vaccines. Persons who believe they may have been injured by a vaccine can learn about the program and about filing a claim by calling 1-800-338-2382 or visiting the VICP website at www.hrsa.gov/vaccinecompensation. There is a time limit to file a claim for compensation. 7. How can I learn more?  Ask your doctor. He or she can give you the vaccine package insert or suggest other sources of information.  Call your local or state health department.  Contact the Centers for Disease Control and Prevention (CDC): ? Call 1-800-232-4636 (1-800-CDC-INFO) ? Visit CDC's website at www.cdc.gov/vaccines Vaccine Information Statement Td Vaccine (12/27/15) This information is  not intended to replace advice given to you by your health care provider. Make sure you discuss any questions you have with your health care provider. Document Released: 07/01/2006 Document Revised: 04/21/2018 Document Reviewed: 04/21/2018 Elsevier Interactive Patient Education  2020 Elsevier Inc. Glucose Tolerance Test During Pregnancy Why am I having this test? The glucose tolerance test (GTT) is done to check how your body processes sugar (glucose). This is one of several tests used to diagnose diabetes that develops during pregnancy (gestational diabetes mellitus). Gestational diabetes is a temporary form of diabetes that some women develop during pregnancy. It usually occurs during the second trimester of pregnancy and goes away after delivery. Testing (screening) for gestational diabetes usually occurs between 24 and 28 weeks of pregnancy. You may have the GTT test after having a 1-hour glucose screening test if the results from that test indicate that you may have gestational diabetes. You may also have this test if:  You have a history of gestational diabetes.  You have a history of giving birth to very large babies or have experienced repeated fetal loss (stillbirth).  You have signs and symptoms of diabetes, such as: ? Changes in your vision. ? Tingling or numbness in your hands or feet. ? Changes in hunger, thirst, and urination that are not otherwise explained by your pregnancy.   What is being tested? This test measures the amount of glucose in your blood at different times during a period of 3 hours. This indicates how well your body is able to process glucose. What kind of sample is taken?  Blood samples are required for this test. They are usually collected by inserting a needle into a blood vessel. How do I prepare for this test?  For 3 days before your test, eat normally. Have plenty of carbohydrate-rich foods.  Follow instructions from your health care provider  about: ? Eating or drinking restrictions on the day of the test. You may be asked to not eat or drink anything other than water (fast) starting 8-10 hours before the test. ? Changing or stopping your regular medicines. Some medicines may interfere with this test. Tell a health care provider about:  All medicines you are taking, including vitamins, herbs, eye drops, creams, and over-the-counter medicines.  Any blood disorders you have.  Any surgeries you have had.  Any medical conditions you have. What happens during the test? First, your blood glucose will be measured. This is referred to as your fasting blood glucose, since you fasted before the test. Then, you will drink a glucose solution that contains a certain amount of glucose. Your blood glucose will be measured again 1, 2, and 3 hours after drinking the solution. This test takes about 3 hours to complete. You will need to stay at the testing location during this time. During the testing period:  Do not eat or drink anything other than the glucose solution.  Do not exercise.  Do not use any products that contain nicotine or tobacco, such as cigarettes and e-cigarettes. If you need help stopping, ask your health care provider. The testing procedure may vary among health care providers and hospitals. How are the results reported? Your results will be reported as milligrams of glucose per deciliter of blood (mg/dL) or millimoles per liter (mmol/L). Your health care provider will compare your results to normal ranges that were established after testing a large group of people (reference ranges). Reference ranges may vary among labs and hospitals. For this test, common reference ranges are:  Fasting: less than 95-105 mg/dL (5.3-5.8 mmol/L).  1 hour after drinking glucose: less than 180-190 mg/dL (10.0-10.5 mmol/L).  2 hours after drinking glucose: less than 155-165 mg/dL (8.6-9.2 mmol/L).  3 hours after drinking glucose: 140-145  mg/dL (7.8-8.1 mmol/L). What do the results mean? Results within reference ranges are considered normal, meaning that your glucose levels are well-controlled. If two or more of your blood glucose levels are high, you may be diagnosed with gestational diabetes. If only one level is high, your health care provider may suggest repeat testing or other tests to confirm a diagnosis. Talk with your health care provider about what your results mean. Questions to ask your health care provider Ask your health care provider, or the department that is doing the test:  When will my results be ready?  How will I get my results?  What are my treatment options?  What other tests do I need?  What are my next steps? Summary  The glucose tolerance test (GTT) is one of several tests used to diagnose diabetes that develops during pregnancy (gestational diabetes mellitus). Gestational diabetes is a temporary form of diabetes that some women develop during pregnancy.  You may have the GTT test after having a 1-hour glucose screening test if the results from that test indicate that you may have gestational diabetes.   You may also have this test if you have any symptoms or risk factors for gestational diabetes.  Talk with your health care provider about what your results mean. This information is not intended to replace advice given to you by your health care provider. Make sure you discuss any questions you have with your health care provider. Document Released: 03/04/2012 Document Revised: 12/25/2018 Document Reviewed: 04/15/2017 Elsevier Patient Education  2020 Elsevier Inc.  

## 2019-06-06 LAB — CBC
Hematocrit: 38.3 % (ref 34.0–46.6)
Hemoglobin: 12.8 g/dL (ref 11.1–15.9)
MCH: 30.5 pg (ref 26.6–33.0)
MCHC: 33.4 g/dL (ref 31.5–35.7)
MCV: 91 fL (ref 79–97)
Platelets: 195 10*3/uL (ref 150–450)
RBC: 4.19 x10E6/uL (ref 3.77–5.28)
RDW: 12.3 % (ref 11.7–15.4)
WBC: 5.9 10*3/uL (ref 3.4–10.8)

## 2019-06-06 LAB — GLUCOSE, 1 HOUR GESTATIONAL: Gestational Diabetes Screen: 93 mg/dL (ref 65–139)

## 2019-06-06 LAB — RPR: RPR Ser Ql: NONREACTIVE

## 2019-06-19 ENCOUNTER — Ambulatory Visit (INDEPENDENT_AMBULATORY_CARE_PROVIDER_SITE_OTHER): Payer: Medicaid Other | Admitting: Obstetrics and Gynecology

## 2019-06-19 ENCOUNTER — Other Ambulatory Visit: Payer: Self-pay

## 2019-06-19 VITALS — BP 117/64 | HR 77 | Wt 170.8 lb

## 2019-06-19 DIAGNOSIS — Z3A30 30 weeks gestation of pregnancy: Secondary | ICD-10-CM

## 2019-06-19 DIAGNOSIS — Z3493 Encounter for supervision of normal pregnancy, unspecified, third trimester: Secondary | ICD-10-CM

## 2019-06-19 LAB — POCT URINALYSIS DIPSTICK OB
Bilirubin, UA: NEGATIVE
Blood, UA: NEGATIVE
Glucose, UA: NEGATIVE
Ketones, UA: NEGATIVE
Leukocytes, UA: NEGATIVE
Nitrite, UA: NEGATIVE
Spec Grav, UA: 1.015 (ref 1.010–1.025)
Urobilinogen, UA: 0.2 E.U./dL
pH, UA: 6 (ref 5.0–8.0)

## 2019-06-19 NOTE — Progress Notes (Signed)
ROB: doing well, no concerns today. 

## 2019-06-19 NOTE — Progress Notes (Signed)
ROB- pt is doing well 

## 2019-07-03 ENCOUNTER — Ambulatory Visit (INDEPENDENT_AMBULATORY_CARE_PROVIDER_SITE_OTHER): Payer: Medicaid Other | Admitting: Certified Nurse Midwife

## 2019-07-03 ENCOUNTER — Other Ambulatory Visit (HOSPITAL_COMMUNITY)
Admission: RE | Admit: 2019-07-03 | Discharge: 2019-07-03 | Disposition: A | Discharge status: Home / Self Care | Payer: Medicaid Other | Source: Ambulatory Visit | Attending: Certified Nurse Midwife | Admitting: Certified Nurse Midwife

## 2019-07-03 VITALS — BP 94/65 | HR 80 | Wt 172.6 lb

## 2019-07-03 DIAGNOSIS — O26893 Other specified pregnancy related conditions, third trimester: Secondary | ICD-10-CM

## 2019-07-03 DIAGNOSIS — Z3493 Encounter for supervision of normal pregnancy, unspecified, third trimester: Secondary | ICD-10-CM

## 2019-07-03 DIAGNOSIS — R102 Pelvic and perineal pain: Secondary | ICD-10-CM | POA: Diagnosis present

## 2019-07-03 DIAGNOSIS — Z3A32 32 weeks gestation of pregnancy: Secondary | ICD-10-CM

## 2019-07-03 LAB — POCT URINALYSIS DIPSTICK OB
Bilirubin, UA: NEGATIVE
Blood, UA: NEGATIVE
Glucose, UA: NEGATIVE
Ketones, UA: NEGATIVE
Leukocytes, UA: NEGATIVE
Nitrite, UA: NEGATIVE
Spec Grav, UA: 1.015 (ref 1.010–1.025)
Urobilinogen, UA: 0.2 E.U./dL
pH, UA: 7 (ref 5.0–8.0)

## 2019-07-03 NOTE — Progress Notes (Signed)
ROB-Reports intermittent sharp pelvic pain for the last three (3) days, no change in vaginal discharge. Has not attempted home treatment measures, education provided. Cervix visually closed on inspection; vaginal swab collected, see orders. Anticipatory guidance regarding course of prenatal care. Reviewed red flag symptoms and when to call. RTC x 2 weeks for ROB or sooner if needed.

## 2019-07-03 NOTE — Patient Instructions (Addendum)
Back Pain in Pregnancy Back pain during pregnancy is common. Back pain may be caused by several factors that are related to changes during your pregnancy. Follow these instructions at home: Managing pain, stiffness, and swelling      If directed, for sudden (acute) back pain, put ice on the painful area. ? Put ice in a plastic bag. ? Place a towel between your skin and the bag. ? Leave the ice on for 20 minutes, 2-3 times per day.  If directed, apply heat to the affected area before you exercise. Use the heat source that your health care provider recommends, such as a moist heat pack or a heating pad. ? Place a towel between your skin and the heat source. ? Leave the heat on for 20-30 minutes. ? Remove the heat if your skin turns bright red. This is especially important if you are unable to feel pain, heat, or cold. You may have a greater risk of getting burned.  If directed, massage the affected area. Activity  Exercise as told by your health care provider. Gentle exercise is the best way to prevent or manage back pain.  Listen to your body when lifting. If lifting hurts, ask for help or bend your knees. This uses your leg muscles instead of your back muscles.  Squat down when picking up something from the floor. Do not bend over.  Only use bed rest for short periods as told by your health care provider. Bed rest should only be used for the most severe episodes of back pain. Standing, sitting, and lying down  Do not stand in one place for long periods of time.  Use good posture when sitting. Make sure your head rests over your shoulders and is not hanging forward. Use a pillow on your lower back if necessary.  Try sleeping on your side, preferably the left side, with a pregnancy support pillow or 1-2 regular pillows between your legs. ? If you have back pain after a night's rest, your bed may be too soft. ? A firm mattress may provide more support for your back during pregnancy.  General instructions  Do not wear high heels.  Eat a healthy diet. Try to gain weight within your health care provider's recommendations.  Use a maternity girdle, elastic sling, or back brace as told by your health care provider.  Take over-the-counter and prescription medicines only as told by your health care provider.  Work with a physical therapist or massage therapist to find ways to manage back pain. Acupuncture or massage therapy may be helpful.  Keep all follow-up visits as told by your health care provider. This is important. Contact a health care provider if:  Your back pain interferes with your daily activities.  You have increasing pain in other parts of your body. Get help right away if:  You develop numbness, tingling, weakness, or problems with the use of your arms or legs.  You develop severe back pain that is not controlled with medicine.  You have a change in bowel or bladder control.  You develop shortness of breath, dizziness, or you faint.  You develop nausea, vomiting, or sweating.  You have back pain that is a rhythmic, cramping pain similar to labor pains. Labor pain is usually 1-2 minutes apart, lasts for about 1 minute, and involves a bearing down feeling or pressure in your pelvis.  You have back pain and your water breaks or you have vaginal bleeding.  You have back pain or numbness   that travels down your leg.  Your back pain developed after you fell.  You develop pain on one side of your back.  You see blood in your urine.  You develop skin blisters in the area of your back pain. Summary  Back pain may be caused by several factors that are related to changes during your pregnancy.  Follow instructions as told by your health care provider for managing pain, stiffness, and swelling.  Exercise as told by your health care provider. Gentle exercise is the best way to prevent or manage back pain.  Take over-the-counter and prescription  medicines only as told by your health care provider.  Keep all follow-up visits as told by your health care provider. This is important. This information is not intended to replace advice given to you by your health care provider. Make sure you discuss any questions you have with your health care provider. Document Released: 12/12/2005 Document Revised: 12/23/2018 Document Reviewed: 02/19/2018 Elsevier Patient Education  2020 [employer]   Abdominal Pain During Pregnancy  Belly (abdominal) pain is common during pregnancy. There are many possible causes. Most of the time, it is not a serious problem. Other times, it can be a sign that something is wrong with the pregnancy. Always tell your doctor if you have belly pain. Follow these instructions at home:  Do not have sex or put anything in your vagina until your pain goes away completely.  Get plenty of rest until your pain gets better.  Drink enough fluid to keep your pee (urine) pale yellow.  Take over-the-counter and prescription medicines only as told by your doctor.  Keep all follow-up visits as told by your doctor. This is important. Contact a doctor if:  Your pain continues or gets worse after resting.  You have lower belly pain that: ? Comes and goes at regular times. ? Spreads to your back. ? Feels like menstrual cramps.  You have pain or burning when you pee (urinate). Get help right away if:  You have a fever or chills.  You have vaginal bleeding.  You are leaking fluid from your vagina.  You are passing tissue from your vagina.  You throw up (vomit) for more than 24 hours.  You have watery poop (diarrhea) for more than 24 hours.  Your baby is moving less than usual.  You feel very weak or faint.  You have shortness of breath.  You have very bad pain in your upper belly. Summary  Belly (abdominal) pain is common during pregnancy. There are many possible causes.  If you have belly pain during  pregnancy, tell your doctor right away.  Keep all follow-up visits as told by your doctor. This is important. This information is not intended to replace advice given to you by your health care provider. Make sure you discuss any questions you have with your health care provider. Document Released: 08/22/2009 Document Revised: 12/22/2018 Document Reviewed: 12/06/2016 Elsevier Patient Education  2020 [employer] Fetal Movement Counts Patient Name: ________________________________________________ Patient Due Date: ____________________ What is a fetal movement count?  A fetal movement count is the number of times that you feel your baby move during a certain amount of time. This may also be called a fetal kick count. A fetal movement count is recommended for every pregnant woman. You may be asked to start counting fetal movements as early as week 28 of your pregnancy. Pay attention to when your baby is most active. You may notice your baby's sleep and   wake cycles. You may also notice things that make your baby move more. You should do a fetal movement count:  When your baby is normally most active.  At the same time each day. A good time to count movements is while you are resting, after having something to eat and drink. How do I count fetal movements? 1. Find a quiet, comfortable area. Sit, or lie down on your side. 2. Write down the date, the start time and stop time, and the number of movements that you felt between those two times. Take this information with you to your health care visits. 3. For 2 hours, count kicks, flutters, swishes, rolls, and jabs. You should feel at least 10 movements during 2 hours. 4. You may stop counting after you have felt 10 movements. 5. If you do not feel 10 movements in 2 hours, have something to eat and drink. Then, keep resting and counting for 1 hour. If you feel at least 4 movements during that hour, you may stop counting. Contact a health care provider  if:  You feel fewer than 4 movements in 2 hours.  Your baby is not moving like he or she usually does. Date: ____________ Start time: ____________ Stop time: ____________ Movements: ____________ Date: ____________ Start time: ____________ Stop time: ____________ Movements: ____________ Date: ____________ Start time: ____________ Stop time: ____________ Movements: ____________ Date: ____________ Start time: ____________ Stop time: ____________ Movements: ____________ Date: ____________ Start time: ____________ Stop time: ____________ Movements: ____________ Date: ____________ Start time: ____________ Stop time: ____________ Movements: ____________ Date: ____________ Start time: ____________ Stop time: ____________ Movements: ____________ Date: ____________ Start time: ____________ Stop time: ____________ Movements: ____________ Date: ____________ Start time: ____________ Stop time: ____________ Movements: ____________ This information is not intended to replace advice given to you by your health care provider. Make sure you discuss any questions you have with your health care provider. Document Released: 10/03/2006 Document Revised: 09/23/2018 Document Reviewed: 10/13/2015 Elsevier Patient Education  2020 [employer].

## 2019-07-03 NOTE — Progress Notes (Signed)
ROB-Patient c/o intermittent sharp pelvic pain x3 days.

## 2019-07-06 LAB — CERVICOVAGINAL ANCILLARY ONLY
Bacterial Vaginitis (gardnerella): NEGATIVE
Candida Glabrata: NEGATIVE
Candida Vaginitis: NEGATIVE
Comment: NEGATIVE
Comment: NEGATIVE
Comment: NEGATIVE
Comment: NEGATIVE
Trichomonas: NEGATIVE

## 2019-07-20 ENCOUNTER — Ambulatory Visit (INDEPENDENT_AMBULATORY_CARE_PROVIDER_SITE_OTHER): Payer: Medicaid Other | Admitting: Certified Nurse Midwife

## 2019-07-20 ENCOUNTER — Encounter: Payer: Self-pay | Admitting: Certified Nurse Midwife

## 2019-07-20 ENCOUNTER — Other Ambulatory Visit: Payer: Self-pay

## 2019-07-20 VITALS — BP 113/59 | HR 76 | Wt 175.1 lb

## 2019-07-20 DIAGNOSIS — Z3493 Encounter for supervision of normal pregnancy, unspecified, third trimester: Secondary | ICD-10-CM

## 2019-07-20 NOTE — Progress Notes (Signed)
ROB doing well. Feels good movement. Thinks she may have lost her mucus plug, reassurance given. Discussed GBS testing next visit. She verbalizes and agrees to plan. Follow up 1 wk.   Philip Aspen, CNM

## 2019-07-27 ENCOUNTER — Ambulatory Visit (INDEPENDENT_AMBULATORY_CARE_PROVIDER_SITE_OTHER): Payer: Medicaid Other | Admitting: Certified Nurse Midwife

## 2019-07-27 ENCOUNTER — Other Ambulatory Visit: Payer: Self-pay

## 2019-07-27 VITALS — BP 120/67 | HR 81 | Wt 176.2 lb

## 2019-07-27 DIAGNOSIS — Z113 Encounter for screening for infections with a predominantly sexual mode of transmission: Secondary | ICD-10-CM

## 2019-07-27 DIAGNOSIS — Z3685 Encounter for antenatal screening for Streptococcus B: Secondary | ICD-10-CM

## 2019-07-27 DIAGNOSIS — Z3A36 36 weeks gestation of pregnancy: Secondary | ICD-10-CM

## 2019-07-27 DIAGNOSIS — Z3493 Encounter for supervision of normal pregnancy, unspecified, third trimester: Secondary | ICD-10-CM

## 2019-07-27 LAB — POCT URINALYSIS DIPSTICK OB
Bilirubin, UA: NEGATIVE
Blood, UA: NEGATIVE
Glucose, UA: NEGATIVE
Ketones, UA: NEGATIVE
Leukocytes, UA: NEGATIVE
Nitrite, UA: NEGATIVE
Spec Grav, UA: 1.02 (ref 1.010–1.025)
Urobilinogen, UA: 0.2 E.U./dL
pH, UA: 6.5 (ref 5.0–8.0)

## 2019-07-27 NOTE — Progress Notes (Signed)
ROB-No complaints.  

## 2019-07-27 NOTE — Patient Instructions (Signed)
Fetal Movement Counts Patient Name: ________________________________________________ Patient Due Date: ____________________ What is a fetal movement count?  A fetal movement count is the number of times that you feel your baby move during a certain amount of time. This may also be called a fetal kick count. A fetal movement count is recommended for every pregnant woman. You may be asked to start counting fetal movements as early as week 28 of your pregnancy. Pay attention to when your baby is most active. You may notice your baby's sleep and wake cycles. You may also notice things that make your baby move more. You should do a fetal movement count:  When your baby is normally most active.  At the same time each day. A good time to count movements is while you are resting, after having something to eat and drink. How do I count fetal movements? 1. Find a quiet, comfortable area. Sit, or lie down on your side. 2. Write down the date, the start time and stop time, and the number of movements that you felt between those two times. Take this information with you to your health care visits. 3. For 2 hours, count kicks, flutters, swishes, rolls, and jabs. You should feel at least 10 movements during 2 hours. 4. You may stop counting after you have felt 10 movements. 5. If you do not feel 10 movements in 2 hours, have something to eat and drink. Then, keep resting and counting for 1 hour. If you feel at least 4 movements during that hour, you may stop counting. Contact a health care provider if:  You feel fewer than 4 movements in 2 hours.  Your baby is not moving like he or she usually does. Date: ____________ Start time: ____________ Stop time: ____________ Movements: ____________ Date: ____________ Start time: ____________ Stop time: ____________ Movements: ____________ Date: ____________ Start time: ____________ Stop time: ____________ Movements: ____________ Date: ____________ Start time:  ____________ Stop time: ____________ Movements: ____________ Date: ____________ Start time: ____________ Stop time: ____________ Movements: ____________ Date: ____________ Start time: ____________ Stop time: ____________ Movements: ____________ Date: ____________ Start time: ____________ Stop time: ____________ Movements: ____________ Date: ____________ Start time: ____________ Stop time: ____________ Movements: ____________ Date: ____________ Start time: ____________ Stop time: ____________ Movements: ____________ This information is not intended to replace advice given to you by your health care provider. Make sure you discuss any questions you have with your health care provider. Document Released: 10/03/2006 Document Revised: 09/23/2018 Document Reviewed: 10/13/2015 Elsevier Patient Education  2020 [employer]. Group B Streptococcus Test During Pregnancy Why am I having this test? Routine testing, also called screening, for group B streptococcus (GBS) is recommended for all pregnant women between the 36th and 37th week of pregnancy. GBS is a type of bacteria that can be passed from mother to baby during childbirth. Screening will help guide whether or not you will need treatment during labor and delivery to prevent complications such as:  An infection in your uterus during labor.  An infection in your uterus after delivery.  A serious infection in your baby after delivery, such as pneumonia, meningitis, or sepsis. GBS screening is not often done before 36 weeks of pregnancy unless you go into labor prematurely. What happens if I have group B streptococcus? If testing shows that you have GBS, your health care provider will recommend treatment with IV antibiotics during labor and delivery. This treatment significantly decreases the risk of complications for you and your baby. If you have a planned C-section and you  have GBS, you may not need to be treated with antibiotics because GBS is  usually passed to babies after labor starts and your water breaks. If you are in labor or your water breaks before your C-section, it is possible for GBS to get into your uterus and be passed to your baby, so you might need treatment. Is there a chance I may not need to be tested? You may not need to be tested for GBS if:  You have a urine test that shows GBS before 36 to 37 weeks.  You had a baby with GBS infection after a previous delivery. In these cases, you will automatically be treated for GBS during labor and delivery. What is being tested? This test is done to check if you have group B streptococcus in your vagina or rectum. What kind of sample is taken? To collect samples for this test, your health care provider will swab your vagina and rectum with a cotton swab. The sample is then sent to the lab to see if GBS is present. What happens during the test?   You will remove your clothing from the waist down.  You will lie down on an exam table in the same position as you would for a pelvic exam.  Your health care provider will swab your vagina and rectum to collect samples for a culture test.  You will be able to go home after the test and do all your usual activities. How are the results reported? The test results are reported as positive or negative. What do the results mean?  A positive test means you are at risk for passing GBS to your baby during labor and delivery. Your health care provider will recommend that you are treated with an IV antibiotic during labor and delivery.  A negative test means you are at very low risk of passing GBS to your baby. There is still a low risk of passing GBS to your baby because sometimes test results may report that you do not have a condition when you do (false-negative result) or there is a chance that you may become infected with GBS after the test is done. You most likely will not need to be treated with an antibiotic during labor and  delivery. Talk with your health care provider about what your results mean. Questions to ask your health care provider Ask your health care provider, or the department that is doing the test:  When will my results be ready?  How will I get my results?  What are my treatment options? Summary  Routine testing (screening) for group B streptococcus (GBS) is recommended for all pregnant women between the 36th and 37th week of pregnancy.  GBS is a type of bacteria that can be passed from mother to baby during childbirth.  If testing shows that you have GBS, your health care provider will recommend that you are treated with IV antibiotics during labor and delivery. This treatment almost always prevents infection in newborns. This information is not intended to replace advice given to you by your health care provider. Make sure you discuss any questions you have with your health care provider. Document Released: 10/01/2018 Document Revised: 12/25/2018 Document Reviewed: 10/01/2018 Elsevier Patient Education  2020 [employer].

## 2019-07-27 NOTE — Progress Notes (Signed)
ROB-Doing well, no questions or concerns. Cultures collected, see orders. Encouraged to start herbal prep next week. Considering transfer of care since currently residing in [place] to complete medical record release and continue current care until established with new practice. Reviewed red flag symptoms and when to call. RTC x 1 week for ROB or sooner if needed.

## 2019-07-29 LAB — STREP GP B NAA: Strep Gp B NAA: NEGATIVE

## 2019-07-29 LAB — GC/CHLAMYDIA PROBE AMP
Chlamydia trachomatis, NAA: NEGATIVE
Neisseria Gonorrhoeae by PCR: NEGATIVE

## 2019-07-30 ENCOUNTER — Encounter: Payer: Self-pay | Admitting: Certified Nurse Midwife

## 2019-08-03 ENCOUNTER — Ambulatory Visit (INDEPENDENT_AMBULATORY_CARE_PROVIDER_SITE_OTHER): Payer: Medicaid Other | Admitting: Certified Nurse Midwife

## 2019-08-03 ENCOUNTER — Encounter: Payer: Self-pay | Admitting: Certified Nurse Midwife

## 2019-08-03 ENCOUNTER — Other Ambulatory Visit: Payer: Self-pay

## 2019-08-03 VITALS — BP 107/62 | HR 84 | Wt 182.0 lb

## 2019-08-03 DIAGNOSIS — Z3403 Encounter for supervision of normal first pregnancy, third trimester: Secondary | ICD-10-CM

## 2019-08-03 NOTE — Patient Instructions (Signed)
Braxton Hicks Contractions Contractions of the uterus can occur throughout pregnancy, but they are not always a sign that you are in labor. You may have practice contractions called Braxton Hicks contractions. These false labor contractions are sometimes confused with true labor. What are Braxton Hicks contractions? Braxton Hicks contractions are tightening movements that occur in the muscles of the uterus before labor. Unlike true labor contractions, these contractions do not result in opening (dilation) and thinning of the cervix. Toward the end of pregnancy (32-34 weeks), Braxton Hicks contractions can happen more often and may become stronger. These contractions are sometimes difficult to tell apart from true labor because they can be very uncomfortable. You should not feel embarrassed if you go to the hospital with false labor. Sometimes, the only way to tell if you are in true labor is for your health care provider to look for changes in the cervix. The health care provider will do a physical exam and may monitor your contractions. If you are not in true labor, the exam should show that your cervix is not dilating and your water has not broken. If there are no other health problems associated with your pregnancy, it is completely safe for you to be sent home with false labor. You may continue to have Braxton Hicks contractions until you go into true labor. How to tell the difference between true labor and false labor True labor  Contractions last 30-70 seconds.  Contractions become very regular.  Discomfort is usually felt in the top of the uterus, and it spreads to the lower abdomen and low back.  Contractions do not go away with walking.  Contractions usually become more intense and increase in frequency.  The cervix dilates and gets thinner. False labor  Contractions are usually shorter and not as strong as true labor contractions.  Contractions are usually irregular.  Contractions  are often felt in the front of the lower abdomen and in the groin.  Contractions may go away when you walk around or change positions while lying down.  Contractions get weaker and are shorter-lasting as time goes on.  The cervix usually does not dilate or become thin. Follow these instructions at home:   Take over-the-counter and prescription medicines only as told by your health care provider.  Keep up with your usual exercises and follow other instructions from your health care provider.  Eat and drink lightly if you think you are going into labor.  If Braxton Hicks contractions are making you uncomfortable: ? Change your position from lying down or resting to walking, or change from walking to resting. ? Sit and rest in a tub of warm water. ? Drink enough fluid to keep your urine pale yellow. Dehydration may cause these contractions. ? Do slow and deep breathing several times an hour.  Keep all follow-up prenatal visits as told by your health care provider. This is important. Contact a health care provider if:  You have a fever.  You have continuous pain in your abdomen. Get help right away if:  Your contractions become stronger, more regular, and closer together.  You have fluid leaking or gushing from your vagina.  You pass blood-tinged mucus (bloody show).  You have bleeding from your vagina.  You have low back pain that you never had before.  You feel your baby's head pushing down and causing pelvic pressure.  Your baby is not moving inside you as much as it used to. Summary  Contractions that occur before labor are   called Braxton Hicks contractions, false labor, or practice contractions.  Braxton Hicks contractions are usually shorter, weaker, farther apart, and less regular than true labor contractions. True labor contractions usually become progressively stronger and regular, and they become more frequent.  Manage discomfort from Braxton Hicks contractions  by changing position, resting in a warm bath, drinking plenty of water, or practicing deep breathing. This information is not intended to replace advice given to you by your health care provider. Make sure you discuss any questions you have with your health care provider. Document Released: 01/17/2017 Document Revised: 08/16/2017 Document Reviewed: 01/17/2017 Elsevier Patient Education  2020 [employer] Inc.  

## 2019-08-03 NOTE — Progress Notes (Signed)
ROB doing well. Feels good movement. Reviewed GBS results. Discuss labor precautions. Follow up 1 wk.   Philip Aspen, CNM

## 2019-08-04 LAB — POCT URINALYSIS DIPSTICK OB
Bilirubin, UA: NEGATIVE
Blood, UA: NEGATIVE
Glucose, UA: NEGATIVE
Ketones, UA: NEGATIVE
Leukocytes, UA: NEGATIVE
Nitrite, UA: NEGATIVE
POC,PROTEIN,UA: NEGATIVE
Spec Grav, UA: <=1.005 — AB (ref 1.010–1.025)
Urobilinogen, UA: 0.2 E.U./dL
pH, UA: 5 (ref 5.0–8.0)

## 2019-08-04 NOTE — Addendum Note (Signed)
Addended by: Raliegh Ip on: 08/04/2019 11:41 AM   Modules accepted: Orders

## 2019-08-10 ENCOUNTER — Other Ambulatory Visit: Payer: Self-pay

## 2019-08-10 ENCOUNTER — Encounter: Payer: Self-pay | Admitting: Certified Nurse Midwife

## 2019-08-10 ENCOUNTER — Ambulatory Visit (INDEPENDENT_AMBULATORY_CARE_PROVIDER_SITE_OTHER): Payer: Medicaid Other | Admitting: Certified Nurse Midwife

## 2019-08-10 VITALS — BP 119/65 | HR 77 | Wt 182.4 lb

## 2019-08-10 DIAGNOSIS — Z3403 Encounter for supervision of normal first pregnancy, third trimester: Secondary | ICD-10-CM

## 2019-08-10 LAB — POCT URINALYSIS DIPSTICK OB
Bilirubin, UA: NEGATIVE
Blood, UA: NEGATIVE
Glucose, UA: NEGATIVE
Ketones, UA: NEGATIVE
Leukocytes, UA: NEGATIVE
Nitrite, UA: NEGATIVE
POC,PROTEIN,UA: NEGATIVE
Spec Grav, UA: 1.015 (ref 1.010–1.025)
Urobilinogen, UA: 0.2 E.U./dL
pH, UA: 5 (ref 5.0–8.0)

## 2019-08-10 NOTE — Progress Notes (Signed)
ROB doing well. Feels good movement. Labor precautions reviewed. SVE 2/50/-3. ROB 1 wk. Discussed change in office staff and back up midwivw/MD to cover some call. Pt verbalizes and agrees to plan.   Philip Aspen, CNM

## 2019-08-10 NOTE — Patient Instructions (Signed)
Braxton Hicks Contractions Contractions of the uterus can occur throughout pregnancy, but they are not always a sign that you are in labor. You may have practice contractions called Braxton Hicks contractions. These false labor contractions are sometimes confused with true labor. What are Braxton Hicks contractions? Braxton Hicks contractions are tightening movements that occur in the muscles of the uterus before labor. Unlike true labor contractions, these contractions do not result in opening (dilation) and thinning of the cervix. Toward the end of pregnancy (32-34 weeks), Braxton Hicks contractions can happen more often and may become stronger. These contractions are sometimes difficult to tell apart from true labor because they can be very uncomfortable. You should not feel embarrassed if you go to the hospital with false labor. Sometimes, the only way to tell if you are in true labor is for your health care provider to look for changes in the cervix. The health care provider will do a physical exam and may monitor your contractions. If you are not in true labor, the exam should show that your cervix is not dilating and your water has not broken. If there are no other health problems associated with your pregnancy, it is completely safe for you to be sent home with false labor. You may continue to have Braxton Hicks contractions until you go into true labor. How to tell the difference between true labor and false labor True labor  Contractions last 30-70 seconds.  Contractions become very regular.  Discomfort is usually felt in the top of the uterus, and it spreads to the lower abdomen and low back.  Contractions do not go away with walking.  Contractions usually become more intense and increase in frequency.  The cervix dilates and gets thinner. False labor  Contractions are usually shorter and not as strong as true labor contractions.  Contractions are usually irregular.  Contractions  are often felt in the front of the lower abdomen and in the groin.  Contractions may go away when you walk around or change positions while lying down.  Contractions get weaker and are shorter-lasting as time goes on.  The cervix usually does not dilate or become thin. Follow these instructions at home:   Take over-the-counter and prescription medicines only as told by your health care provider.  Keep up with your usual exercises and follow other instructions from your health care provider.  Eat and drink lightly if you think you are going into labor.  If Braxton Hicks contractions are making you uncomfortable: ? Change your position from lying down or resting to walking, or change from walking to resting. ? Sit and rest in a tub of warm water. ? Drink enough fluid to keep your urine pale yellow. Dehydration may cause these contractions. ? Do slow and deep breathing several times an hour.  Keep all follow-up prenatal visits as told by your health care provider. This is important. Contact a health care provider if:  You have a fever.  You have continuous pain in your abdomen. Get help right away if:  Your contractions become stronger, more regular, and closer together.  You have fluid leaking or gushing from your vagina.  You pass blood-tinged mucus (bloody show).  You have bleeding from your vagina.  You have low back pain that you never had before.  You feel your baby's head pushing down and causing pelvic pressure.  Your baby is not moving inside you as much as it used to. Summary  Contractions that occur before labor are   called Braxton Hicks contractions, false labor, or practice contractions.  Braxton Hicks contractions are usually shorter, weaker, farther apart, and less regular than true labor contractions. True labor contractions usually become progressively stronger and regular, and they become more frequent.  Manage discomfort from Braxton Hicks contractions  by changing position, resting in a warm bath, drinking plenty of water, or practicing deep breathing. This information is not intended to replace advice given to you by your health care provider. Make sure you discuss any questions you have with your health care provider. Document Released: 01/17/2017 Document Revised: 08/16/2017 Document Reviewed: 01/17/2017 Elsevier Patient Education  2020 [employer] Inc.  

## 2019-08-17 ENCOUNTER — Encounter: Payer: Medicaid Other | Admitting: Certified Nurse Midwife

## 2019-08-17 ENCOUNTER — Telehealth: Payer: Self-pay

## 2019-08-17 ENCOUNTER — Ambulatory Visit (INDEPENDENT_AMBULATORY_CARE_PROVIDER_SITE_OTHER): Payer: Medicaid Other | Admitting: Certified Nurse Midwife

## 2019-08-17 ENCOUNTER — Other Ambulatory Visit: Payer: Self-pay

## 2019-08-17 VITALS — BP 118/73 | HR 95 | Wt 180.5 lb

## 2019-08-17 DIAGNOSIS — Z3403 Encounter for supervision of normal first pregnancy, third trimester: Secondary | ICD-10-CM

## 2019-08-17 LAB — POCT URINALYSIS DIPSTICK OB
Bilirubin, UA: NEGATIVE
Blood, UA: NEGATIVE
Glucose, UA: NEGATIVE
Ketones, UA: NEGATIVE
Leukocytes, UA: NEGATIVE
Nitrite, UA: NEGATIVE
POC,PROTEIN,UA: NEGATIVE
Spec Grav, UA: 1.025 (ref 1.010–1.025)
Urobilinogen, UA: 0.2 E.U./dL
pH, UA: 5 (ref 5.0–8.0)

## 2019-08-17 NOTE — Progress Notes (Signed)
ROB doing well, feels good movement. Discussed u/s for growth/AFI due to post dates. She verbalizes and agrees to plan. Follow up 1 wk   Philip Aspen, CNM

## 2019-08-17 NOTE — Patient Instructions (Signed)
Braxton Hicks Contractions Contractions of the uterus can occur throughout pregnancy, but they are not always a sign that you are in labor. You may have practice contractions called Braxton Hicks contractions. These false labor contractions are sometimes confused with true labor. What are Braxton Hicks contractions? Braxton Hicks contractions are tightening movements that occur in the muscles of the uterus before labor. Unlike true labor contractions, these contractions do not result in opening (dilation) and thinning of the cervix. Toward the end of pregnancy (32-34 weeks), Braxton Hicks contractions can happen more often and may become stronger. These contractions are sometimes difficult to tell apart from true labor because they can be very uncomfortable. You should not feel embarrassed if you go to the hospital with false labor. Sometimes, the only way to tell if you are in true labor is for your health care provider to look for changes in the cervix. The health care provider will do a physical exam and may monitor your contractions. If you are not in true labor, the exam should show that your cervix is not dilating and your water has not broken. If there are no other health problems associated with your pregnancy, it is completely safe for you to be sent home with false labor. You may continue to have Braxton Hicks contractions until you go into true labor. How to tell the difference between true labor and false labor True labor  Contractions last 30-70 seconds.  Contractions become very regular.  Discomfort is usually felt in the top of the uterus, and it spreads to the lower abdomen and low back.  Contractions do not go away with walking.  Contractions usually become more intense and increase in frequency.  The cervix dilates and gets thinner. False labor  Contractions are usually shorter and not as strong as true labor contractions.  Contractions are usually irregular.  Contractions  are often felt in the front of the lower abdomen and in the groin.  Contractions may go away when you walk around or change positions while lying down.  Contractions get weaker and are shorter-lasting as time goes on.  The cervix usually does not dilate or become thin. Follow these instructions at home:   Take over-the-counter and prescription medicines only as told by your health care provider.  Keep up with your usual exercises and follow other instructions from your health care provider.  Eat and drink lightly if you think you are going into labor.  If Braxton Hicks contractions are making you uncomfortable: ? Change your position from lying down or resting to walking, or change from walking to resting. ? Sit and rest in a tub of warm water. ? Drink enough fluid to keep your urine pale yellow. Dehydration may cause these contractions. ? Do slow and deep breathing several times an hour.  Keep all follow-up prenatal visits as told by your health care provider. This is important. Contact a health care provider if:  You have a fever.  You have continuous pain in your abdomen. Get help right away if:  Your contractions become stronger, more regular, and closer together.  You have fluid leaking or gushing from your vagina.  You pass blood-tinged mucus (bloody show).  You have bleeding from your vagina.  You have low back pain that you never had before.  You feel your baby's head pushing down and causing pelvic pressure.  Your baby is not moving inside you as much as it used to. Summary  Contractions that occur before labor are   called Braxton Hicks contractions, false labor, or practice contractions.  Braxton Hicks contractions are usually shorter, weaker, farther apart, and less regular than true labor contractions. True labor contractions usually become progressively stronger and regular, and they become more frequent.  Manage discomfort from Braxton Hicks contractions  by changing position, resting in a warm bath, drinking plenty of water, or practicing deep breathing. This information is not intended to replace advice given to you by your health care provider. Make sure you discuss any questions you have with your health care provider. Document Released: 01/17/2017 Document Revised: 08/16/2017 Document Reviewed: 01/17/2017 Elsevier Patient Education  2020 [employer] Inc.  

## 2019-08-17 NOTE — Addendum Note (Signed)
Addended by: Raliegh Ip on: 08/17/2019 03:19 PM   Modules accepted: Orders

## 2019-08-17 NOTE — Telephone Encounter (Signed)
Patient has an appointment today 08/17/19.

## 2019-08-26 ENCOUNTER — Other Ambulatory Visit: Payer: Medicaid Other

## 2019-08-26 ENCOUNTER — Encounter: Payer: Medicaid Other | Admitting: Certified Nurse Midwife

## 2019-08-27 ENCOUNTER — Telehealth: Payer: Self-pay

## 2019-08-27 NOTE — Telephone Encounter (Signed)
Called patient to followup on missed appointments. She states she delivered elsewhere on 08/25/19. She did not say where she delivered.

## 2019-10-23 ENCOUNTER — Telehealth: Payer: Self-pay

## 2019-10-23 NOTE — Telephone Encounter (Signed)
Suzanne Nixon pt: Exp pelvic pain and slight odor. She's seen Pattricia Boss, but Pattricia Boss doesn't have any immediate openings. I schel patient with Marcelino Duster on 2/15. Patient wants to be seen sooner. Pls call patient

## 2019-10-23 NOTE — Telephone Encounter (Signed)
Called and spoke with patient.  Patient requesting sooner appointment.  Appointment scheduled for 2/8 at 1430 with Doreene Burke, CNM and patient verbalized understanding.

## 2019-10-26 ENCOUNTER — Other Ambulatory Visit: Payer: Self-pay

## 2019-10-26 ENCOUNTER — Ambulatory Visit (INDEPENDENT_AMBULATORY_CARE_PROVIDER_SITE_OTHER): Payer: Medicaid Other | Admitting: Certified Nurse Midwife

## 2019-10-26 ENCOUNTER — Encounter: Payer: Self-pay | Admitting: Certified Nurse Midwife

## 2019-10-26 ENCOUNTER — Other Ambulatory Visit (HOSPITAL_COMMUNITY)
Admission: RE | Admit: 2019-10-26 | Discharge: 2019-10-26 | Disposition: A | Discharge status: Home / Self Care | Payer: Medicaid Other | Source: Ambulatory Visit | Attending: Certified Nurse Midwife | Admitting: Certified Nurse Midwife

## 2019-10-26 VITALS — BP 110/68 | HR 84 | Ht 62.0 in | Wt 175.6 lb

## 2019-10-26 DIAGNOSIS — R102 Pelvic and perineal pain: Secondary | ICD-10-CM | POA: Insufficient documentation

## 2019-10-26 MED ORDER — PRENATAL 27-0.8 MG PO TABS: 1.0000 | ORAL_TABLET | Freq: Every day | ORAL | 12 refills | Status: AC

## 2019-10-26 NOTE — Progress Notes (Signed)
GYN ENCOUNTER NOTE  Subjective:       Suzanne Nixon is a 24 y.o. G62P0000 female is here for gynecologic evaluation of the following issues:  1. stabbing pain and discharge with odor x 1.5 wks. She is sexually active with once female partner and has concerns that she may have an infection , states that he has been going out more and she is at home with the baby.    Gynecologic History Patient's last menstrual period was 11/25/2018. Contraception: oral progesterone-only contraceptive ( breastfeeding) Last Pap: 02/20/19  Results were: normal Last mammogram: n/a  Obstetric History OB History  Gravida Para Term Preterm AB Living  1 0 0 0 0 0  SAB TAB Ectopic Multiple Live Births  0 0 0 0 0    # Outcome Date GA Lbr Len/2nd Weight Sex Delivery Anes PTL Lv  1 Gravida             Past Medical History:  Diagnosis Date  . Anxiety   . Finding of floating rib   . Heavy periods   . Pelvic pain     No past surgical history on file.  Current Outpatient Medications on File Prior to Visit  Medication Sig Dispense Refill  . norethindrone (MICRONOR) 0.35 MG tablet Take by mouth.     No current facility-administered medications on file prior to visit.    No Known Allergies  Social History   Socioeconomic History  . Marital status: Single    Spouse name: Not on file  . Number of children: Not on file  . Years of education: Not on file  . Highest education level: Not on file  Occupational History  . Not on file  Tobacco Use  . Smoking status: Never Smoker  . Smokeless tobacco: Never Used  Substance and Sexual Activity  . Alcohol use: No  . Drug use: No  . Sexual activity: Yes    Birth control/protection: Pill  Other Topics Concern  . Not on file  Social History Narrative  . Not on file   Social Determinants of Health   Financial Resource Strain:   . Difficulty of Paying Living Expenses: Not on file  Food Insecurity:   . Worried About Programme researcher, broadcasting/film/video in the Last Year:  Not on file  . Ran Out of Food in the Last Year: Not on file  Transportation Needs:   . Lack of Transportation (Medical): Not on file  . Lack of Transportation (Non-Medical): Not on file  Physical Activity:   . Days of Exercise per Week: Not on file  . Minutes of Exercise per Session: Not on file  Stress:   . Feeling of Stress : Not on file  Social Connections:   . Frequency of Communication with Friends and Family: Not on file  . Frequency of Social Gatherings with Friends and Family: Not on file  . Attends Religious Services: Not on file  . Active Member of Clubs or Organizations: Not on file  . Attends Banker Meetings: Not on file  . Marital Status: Not on file  Intimate Partner Violence:   . Fear of Current or Ex-Partner: Not on file  . Emotionally Abused: Not on file  . Physically Abused: Not on file  . Sexually Abused: Not on file    Family History  Problem Relation Age of Onset  . HIV/AIDS Mother   . Cancer Maternal Uncle        lung  . Diabetes Maternal Grandmother  The following portions of the patient's history were reviewed and updated as appropriate: allergies, current medications, past family history, past medical history, past social history, past surgical history and problem list.  Review of Systems Review of Systems - Negative except as mentioned in hpi Review of Systems - General ROS: negative for - chills, fatigue, fever, hot flashes, malaise or night sweats Hematological and Lymphatic ROS: negative for - bleeding problems or swollen lymph nodes Gastrointestinal ROS: negative for - abdominal pain, blood in stools, change in bowel habits and nausea/vomiting Musculoskeletal ROS: negative for - joint pain, muscle pain or muscular weakness Genito-Urinary ROS: negative for - change in menstrual cycle, dysmenorrhea, dyspareunia, dysuria, genital discharge, genital ulcers, hematuria, incontinence, irregular/heavy menses, nocturia. Positive for   pelvic pain, and odor.  Objective:   BP 110/68   Pulse 84   Ht 5\' 2"  (1.575 m)   Wt 175 lb 9 oz (79.6 kg)   LMP 11/25/2018   Breastfeeding Yes   BMI 32.11 kg/m  CONSTITUTIONAL: Well-developed, well-nourished female in no acute distress.  HENT:  Normocephalic, atraumatic.  NECK: Normal range of motion, supple, no masses.  Normal thyroid.  SKIN: Skin is warm and dry. No rash noted. Not diaphoretic. No erythema. No pallor. [place]: Alert and oriented to person, place, and time. PSYCHIATRIC: Normal mood and affect. Normal behavior. Normal judgment and thought content. CARDIOVASCULAR:Not Examined RESPIRATORY: Not Examined BREASTS: Not Examined ABDOMEN: Soft, non distended; Non tender.  No Organomegaly. PELVIC:  External Genitalia: Normal  BUS: Normal  Vagina: Normal  Cervix: Normal appearance, cervical motion tenderness present  Uterus: Normal size, shape,consistency, mobile  Adnexa: Normal  RV: Normal   Bladder: Nontender MUSCULOSKELETAL: Normal range of motion. No tenderness.  No cyanosis, clubbing, or edema.   Assessment:   Vaginal pain  Discharge with odor.    Plan:   Vaginal swab collected. Will follow up with results. Discussed use of pelvic floor therapy given that she had a baby 2 months ago. She verbalizes and agrees to plan of care. Follow up PRN.   Philip Aspen, CNM

## 2019-10-26 NOTE — Patient Instructions (Signed)
Vaginitis Vaginitis is a condition in which the vaginal tissue swells and becomes red (inflamed). This condition is most often caused by a change in the normal balance of bacteria and yeast that live in the vagina. This change causes an overgrowth of certain bacteria or yeast, which causes the inflammation. There are different types of vaginitis, but the most common types are:  Bacterial vaginosis.  Yeast infection (candidiasis).  Trichomoniasis vaginitis. This is a sexually transmitted disease (STD).  Viral vaginitis.  Atrophic vaginitis.  Allergic vaginitis. What are the causes? The cause of this condition depends on the type of vaginitis. It can be caused by:  Bacteria (bacterial vaginosis).  Yeast, which is a fungus (yeast infection).  A parasite (trichomoniasis vaginitis).  A virus (viral vaginitis).  Low hormone levels (atrophic vaginitis). Low hormone levels can occur during pregnancy, breastfeeding, or after menopause.  Irritants, such as bubble baths, scented tampons, and feminine sprays (allergic vaginitis). Other factors can change the normal balance of the yeast and bacteria that live in the vagina. These include:  Antibiotic medicines.  Poor hygiene.  Diaphragms, vaginal sponges, spermicides, birth control pills, and intrauterine devices (IUD).  Sex.  Infection.  Uncontrolled diabetes.  A weakened defense (immune) system. What increases the risk? This condition is more likely to develop in women who:  Smoke.  Use vaginal douches, scented tampons, or scented sanitary pads.  Wear tight-fitting pants.  Wear thong underwear.  Use oral birth control pills or an IUD.  Have sex without a condom.  Have multiple sex partners.  Have an STD.  Frequently use the spermicide nonoxynol-9.  Eat lots of foods high in sugar.  Have uncontrolled diabetes.  Have low estrogen levels.  Have a weakened immune system from an immune disorder or medical  treatment.  Are pregnant or breastfeeding. What are the signs or symptoms? Symptoms vary depending on the cause of the vaginitis. Common symptoms include:  Abnormal vaginal discharge. ? The discharge is white, gray, or yellow with bacterial vaginosis. ? The discharge is thick, white, and cheesy with a yeast infection. ? The discharge is frothy and yellow or greenish with trichomoniasis.  A bad vaginal smell. The smell is fishy with bacterial vaginosis.  Vaginal itching, pain, or swelling.  Sex that is painful.  Pain or burning when urinating. Sometimes there are no symptoms. How is this diagnosed? This condition is diagnosed based on your symptoms and medical history. A physical exam, including a pelvic exam, will also be done. You may also have other tests, including:  Tests to determine the pH level (acidity or alkalinity) of your vagina.  A whiff test, to assess the odor that results when a sample of your vaginal discharge is mixed with a potassium hydroxide solution.  Tests of vaginal fluid. A sample will be examined under a microscope. How is this treated? Treatment varies depending on the type of vaginitis you have. Your treatment may include:  Antibiotic creams or pills to treat bacterial vaginosis and trichomoniasis.  Antifungal medicines, such as vaginal creams or suppositories, to treat a yeast infection.  Medicine to ease discomfort if you have viral vaginitis. Your sexual partner should also be treated.  Estrogen delivered in a cream, pill, suppository, or vaginal ring to treat atrophic vaginitis. If vaginal dryness occurs, lubricants and moisturizing creams may help. You may need to avoid scented soaps, sprays, or douches.  Stopping use of a product that is causing allergic vaginitis. Then using a vaginal cream to treat the symptoms. Follow   these instructions at home: Lifestyle  Keep your genital area clean and dry. Avoid soap, and only rinse the area with  water.  Do not douche or use tampons until your health care provider says it is okay to do so. Use sanitary pads, if needed.  Do not have sex until your health care provider approves. When you can return to sex, practice safe sex and use condoms.  Wipe from front to back. This avoids the spread of bacteria from the rectum to the vagina. General instructions  Take over-the-counter and prescription medicines only as told by your health care provider.  If you were prescribed an antibiotic medicine, take or use it as told by your health care provider. Do not stop taking or using the antibiotic even if you start to feel better.  Keep all follow-up visits as told by your health care provider. This is important. How is this prevented?  Use mild, non-scented products. Do not use things that can irritate the vagina, such as fabric softeners. Avoid the following products if they are scented: ? Feminine sprays. ? Detergents. ? Tampons. ? Feminine hygiene products. ? Soaps or bubble baths.  Let air reach your genital area. ? Wear cotton underwear to reduce moisture buildup. ? Avoid wearing underwear while you sleep. ? Avoid wearing tight pants and underwear or nylons without a cotton panel. ? Avoid wearing thong underwear.  Take off any wet clothing, such as bathing suits, as soon as possible.  Practice safe sex and use condoms. Contact a health care provider if:  You have abdominal pain.  You have a fever.  You have symptoms that last for more than 2-3 days. Get help right away if:  You have a fever and your symptoms suddenly get worse. Summary  Vaginitis is a condition in which the vaginal tissue becomes inflamed.This condition is most often caused by a change in the normal balance of bacteria and yeast that live in the vagina.  Treatment varies depending on the type of vaginitis you have.  Do not douche, use tampons , or have sex until your health care provider approves. When  you can return to sex, practice safe sex and use condoms. This information is not intended to replace advice given to you by your health care provider. Make sure you discuss any questions you have with your health care provider. Document Revised: 08/16/2017 Document Reviewed: 10/09/2016 [employer] Patient Education  2020 [employer]  

## 2019-10-28 LAB — CERVICOVAGINAL ANCILLARY ONLY
Bacterial Vaginitis (gardnerella): NEGATIVE
Candida Glabrata: NEGATIVE
Candida Vaginitis: NEGATIVE
Chlamydia: NEGATIVE
Comment: NEGATIVE
Comment: NEGATIVE
Comment: NEGATIVE
Comment: NEGATIVE
Comment: NEGATIVE
Comment: NORMAL
Neisseria Gonorrhea: NEGATIVE
Trichomonas: NEGATIVE

## 2019-11-02 ENCOUNTER — Encounter: Payer: Medicaid Other | Admitting: Certified Nurse Midwife
